# Patient Record
Sex: Male | Born: 1955 | Race: White | Hispanic: No | Marital: Single | Smoking: Current every day smoker
Health system: Southern US, Community
[De-identification: ages and names within clinical notes are randomized; demographics above are authoritative.]

## PROBLEM LIST (undated history)

## (undated) DIAGNOSIS — F419 Anxiety disorder, unspecified: Secondary | ICD-10-CM

## (undated) DIAGNOSIS — G43909 Migraine, unspecified, not intractable, without status migrainosus: Secondary | ICD-10-CM

## (undated) DIAGNOSIS — K219 Gastro-esophageal reflux disease without esophagitis: Secondary | ICD-10-CM

## (undated) DIAGNOSIS — G629 Polyneuropathy, unspecified: Secondary | ICD-10-CM

## (undated) DIAGNOSIS — M069 Rheumatoid arthritis, unspecified: Secondary | ICD-10-CM

## (undated) DIAGNOSIS — I10 Essential (primary) hypertension: Secondary | ICD-10-CM

## (undated) DIAGNOSIS — M549 Dorsalgia, unspecified: Secondary | ICD-10-CM

## (undated) DIAGNOSIS — G8929 Other chronic pain: Secondary | ICD-10-CM

## (undated) DIAGNOSIS — B1920 Unspecified viral hepatitis C without hepatic coma: Secondary | ICD-10-CM

## (undated) DIAGNOSIS — M1990 Unspecified osteoarthritis, unspecified site: Secondary | ICD-10-CM

## (undated) DIAGNOSIS — C3490 Malignant neoplasm of unspecified part of unspecified bronchus or lung: Secondary | ICD-10-CM

## (undated) HISTORY — PX: LUNG SURGERY: SHX703

## (undated) HISTORY — DX: Rheumatoid arthritis, unspecified: M06.9

## (undated) HISTORY — DX: Dorsalgia, unspecified: M54.9

## (undated) HISTORY — PX: KNEE SURGERY: SHX244

## (undated) HISTORY — DX: Migraine, unspecified, not intractable, without status migrainosus: G43.909

## (undated) HISTORY — DX: Gastro-esophageal reflux disease without esophagitis: K21.9

## (undated) HISTORY — DX: Anxiety disorder, unspecified: F41.9

## (undated) HISTORY — DX: Unspecified viral hepatitis C without hepatic coma: B19.20

## (undated) HISTORY — DX: Unspecified osteoarthritis, unspecified site: M19.90

## (undated) HISTORY — DX: Essential (primary) hypertension: I10

## (undated) HISTORY — PX: BRAIN SURGERY: SHX531

## (undated) HISTORY — DX: Polyneuropathy, unspecified: G62.9

## (undated) HISTORY — DX: Malignant neoplasm of unspecified part of unspecified bronchus or lung: C34.90

## (undated) HISTORY — PX: BACK SURGERY: SHX140

## (undated) HISTORY — DX: Other chronic pain: G89.29

## (undated) HISTORY — PX: ROTATOR CUFF REPAIR: SHX139

---

## 2007-11-15 ENCOUNTER — Ambulatory Visit (HOSPITAL_COMMUNITY): Admission: RE | Admit: 2007-11-15 | Discharge: 2007-11-15 | Payer: Self-pay | Admitting: Family Medicine

## 2007-12-13 ENCOUNTER — Ambulatory Visit: Payer: Self-pay | Admitting: Thoracic Surgery

## 2008-02-05 ENCOUNTER — Ambulatory Visit (HOSPITAL_COMMUNITY): Admission: RE | Admit: 2008-02-05 | Discharge: 2008-02-05 | Payer: Self-pay | Admitting: General Surgery

## 2008-03-12 ENCOUNTER — Encounter: Admission: RE | Admit: 2008-03-12 | Discharge: 2008-03-12 | Payer: Self-pay | Admitting: Thoracic Surgery

## 2008-03-12 ENCOUNTER — Ambulatory Visit: Payer: Self-pay | Admitting: Thoracic Surgery

## 2008-09-05 ENCOUNTER — Ambulatory Visit (HOSPITAL_COMMUNITY): Admission: RE | Admit: 2008-09-05 | Discharge: 2008-09-05 | Payer: Self-pay | Admitting: Family Medicine

## 2008-09-16 ENCOUNTER — Encounter: Admission: RE | Admit: 2008-09-16 | Discharge: 2008-09-16 | Payer: Self-pay | Admitting: Thoracic Surgery

## 2008-09-16 ENCOUNTER — Ambulatory Visit: Payer: Self-pay | Admitting: Thoracic Surgery

## 2008-10-16 ENCOUNTER — Ambulatory Visit (HOSPITAL_COMMUNITY): Admission: RE | Admit: 2008-10-16 | Discharge: 2008-10-17 | Payer: Self-pay | Admitting: Neurosurgery

## 2009-05-08 ENCOUNTER — Ambulatory Visit (HOSPITAL_COMMUNITY): Admission: RE | Admit: 2009-05-08 | Discharge: 2009-05-08 | Payer: Self-pay | Admitting: Family Medicine

## 2009-07-29 ENCOUNTER — Ambulatory Visit (HOSPITAL_COMMUNITY): Admission: RE | Admit: 2009-07-29 | Discharge: 2009-07-29 | Payer: Self-pay | Admitting: Family Medicine

## 2010-03-09 ENCOUNTER — Ambulatory Visit (HOSPITAL_COMMUNITY): Admission: RE | Admit: 2010-03-09 | Discharge: 2010-03-09 | Payer: Self-pay | Admitting: Neurosurgery

## 2010-05-04 ENCOUNTER — Ambulatory Visit (HOSPITAL_COMMUNITY): Admission: RE | Admit: 2010-05-04 | Discharge: 2010-05-04 | Payer: Self-pay | Admitting: Neurosurgery

## 2010-05-19 ENCOUNTER — Inpatient Hospital Stay (HOSPITAL_COMMUNITY): Admission: RE | Admit: 2010-05-19 | Discharge: 2010-05-21 | Payer: Self-pay | Admitting: Neurosurgery

## 2010-09-05 ENCOUNTER — Encounter: Payer: Self-pay | Admitting: Thoracic Surgery

## 2010-10-28 LAB — CBC
HCT: 40.6 % (ref 39.0–52.0)
Hemoglobin: 13.6 g/dL (ref 13.0–17.0)
MCH: 33.4 pg (ref 26.0–34.0)
MCHC: 33.5 g/dL (ref 30.0–36.0)
MCV: 99.8 fL (ref 78.0–100.0)
Platelets: 245 10*3/uL (ref 150–400)
RBC: 4.07 MIL/uL — ABNORMAL LOW (ref 4.22–5.81)
RDW: 13.7 % (ref 11.5–15.5)
WBC: 6.8 10*3/uL (ref 4.0–10.5)

## 2010-10-28 LAB — BASIC METABOLIC PANEL
BUN: 12 mg/dL (ref 6–23)
CO2: 31 mEq/L (ref 19–32)
Chloride: 100 mEq/L (ref 96–112)
Creatinine, Ser: 0.82 mg/dL (ref 0.4–1.5)
Potassium: 3.9 mEq/L (ref 3.5–5.1)

## 2010-11-25 LAB — BASIC METABOLIC PANEL
BUN: 11 mg/dL (ref 6–23)
CO2: 27 mEq/L (ref 19–32)
Calcium: 8.7 mg/dL (ref 8.4–10.5)
Chloride: 100 mEq/L (ref 96–112)
Creatinine, Ser: 0.65 mg/dL (ref 0.4–1.5)
GFR calc Af Amer: >60 mL/min (ref >60)
GFR calc non Af Amer: >60 mL/min (ref >60)
Glucose, Bld: 89 mg/dL (ref 70–99)
Potassium: 4.3 mEq/L (ref 3.5–5.1)
Sodium: 135 mEq/L (ref 135–145)

## 2010-11-25 LAB — CBC
HCT: 39.1 % (ref 39.0–52.0)
Hemoglobin: 13.4 g/dL (ref 13.0–17.0)
MCHC: 34.3 g/dL (ref 30.0–36.0)
MCV: 99.1 fL (ref 78.0–100.0)
Platelets: 250 10*3/uL (ref 150–400)
RBC: 3.95 MIL/uL — ABNORMAL LOW (ref 4.22–5.81)
RDW: 14.6 % (ref 11.5–15.5)
WBC: 6.7 10*3/uL (ref 4.0–10.5)

## 2010-11-29 LAB — CREATININE, SERUM
Creatinine, Ser: 0.78 mg/dL (ref 0.4–1.5)
GFR calc Af Amer: >60 mL/min (ref >60)
GFR calc non Af Amer: >60 mL/min (ref >60)

## 2010-12-28 NOTE — Assessment & Plan Note (Signed)
OFFICE VISIT   Dillon, Carter  DOB:  November 28, 1955                                        March 12, 2008  CHART #:  [id number]   The patient comes in today for a 14-month followup.  He has a previous  history of lung cancer having undergone a left lower lobectomy at  [facility].  He was recently seen by Dr. Edwyna Shell in April with a CT scan  showing an area that was questionable in the right middle lobe.  At that  time, Dr. Edwyna Shell felt that it was not concerning for malignancy and was  most consistent with alveolitis or mild scarring.  He did recommend  follow up in 3 months with a chest CT.  The patient has continued to  smoke since that time.  He has had no symptoms related to this and  specifically denies cough, shortness of breath, or hemoptysis.   PHYSICAL EXAMINATION:  VITAL SIGNS:  Blood pressure 148/87, pulse is 73,  respirations 18, and O2 sat 97% on room air.  HEART:  Regular rate and rhythm without murmurs, rubs, or gallops.  LUNGS:  Clear.  Chest CT shows no evidence of recurrent or metastatic  disease.  The area noted in the right middle lobe on the previous x-ray  has resolved.  There was some micronodularity noted in the right upper  lobe, which was felt to be related to either an infectious process or  secondary to smoking such as bronchiolitis or interstitial lung disease.   ASSESSMENT AND PLAN:  Dr. Edwyna Shell has seen the patient and reviewed his  films today and we will plan to see him back in 6 months with a repeat  CT scan for followup.   Dillon Carter, M.D.  Electronically Signed   GC/MEDQ  D:  03/12/2008  T:  03/13/2008  Job:  [id number]   cc:   Dillon Carter, M.D.

## 2010-12-28 NOTE — Assessment & Plan Note (Signed)
OFFICE VISIT   Dillon, Carter  DOB:  Dec 15, 1955                                        December 13, 2007  CHART #:  [id number]   HISTORY OF THE PRESENT ILLNESS:  The patient is a 54 year old male  referred to Dr. Edwyna Shell who saw the patient during this visit.  The  patient has a history of lung cancer, having undergone a previous left  lower lobectomy at [facility] in [place].  He reports that the  type of cancer was related to asbestos exposure.  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  The patient has a long history of tobacco abuse and  continues to smoke approximately 1-1/2 packs per day.  He describes  multiple symptoms including chest pain and pressure type sensation as  well as shortness of breath with exertion.  He describes symptoms  related to reflux as well as symptoms compatible to a Raynaud's type  phenomenon in his hands as well as peripheral vascular arterial  occlusive disease.   MEDICATIONS:  Include the following:  1. Lisinopril/HCTZ 20/12.5 mg daily.  2. Amlodipine 5 mg daily.  3. Hydrocodone p.r.n.   ALLERGIES:  No known drug allergies.   PAST MEDICAL HISTORY:  Also includes hypertension.   FAMILY HISTORY:  Unremarkable.   SOCIAL HISTORY:  He is single.  Tobacco use as described above.  Alcohol  use is significant, approximately 12 alcoholic drinks per week.   PHYSICAL EXAM:  Vital Signs:  Weight is 185 pounds, height 6 feet 0  inches, blood pressure 133/84, pulse 66, respirations 18, oxygen  saturation 99%.  General Appearance:  A well-developed adult male in no  acute distress.  Cardiac Examination:  Regular rate and rhythm.  Pulmonary Examination:  Clear lung sounds.  Extremities:  No edema.   CT scan reviewed by Dr. Edwyna Shell.  Findings in the right middle lobe are  consistent with alveolitis or mild scarring.  Dr. Edwyna Shell does not feel  these are  overly-concerning for malignancy, however, does warrant  further evaluation and followup considering his history.  It is Dr.  Scheryl Darter recommendation to repeat the CT scan in 3 months.  He does not  feel as though the patient needs a PET scan at this time.  We did  discuss the need to a program of tobacco cessation and he will follow up  with his primary physician regarding this.  Additionally, I did discuss  that he is set up for significant vascular disease including peripheral  arterial disease and cardiac disease.  I discussed his chest pain and  told him he may require a stress test of some type, to further evaluate,  if he continues to have these symptoms.  I asked him to discuss this  with his primary physician.  Additionally, the pain in his legs may also  represent, as stated, significant peripheral arterial disease and this  may also warrant further discussion and evaluation.  Our current plan  is, as stated above, to repeat the CT scan in 3 months and go from there  as far as any further evaluation of this right middle lobe finding on CT  scan.   Rowe Clack, P.A.-C.   Sherryll Burger  D:  12/13/2007  T:  12/13/2007  Job:  [id number]   cc:   Ines Bloomer, M.D.  Patrica Duel, M.D.

## 2010-12-28 NOTE — H&P (Signed)
Dillon Carter, Dillon Carter                 ACCOUNT NO.:  [id number]   MEDICAL RECORD NO.:  [id number]           PATIENT TYPE:   LOCATION:                                 FACILITY:   PHYSICIAN:  Dalia Heading, M.D.  DATE OF BIRTH:  1955/12/01   DATE OF ADMISSION:  DATE OF DISCHARGE:  LH                              HISTORY & PHYSICAL   CHIEF COMPLAINT:  Weight loss of unknown etiology, history of lung  carcinoma.   HISTORY OF PRESENT ILLNESS:  The patient is a 55 year old white male who  is referred for an endoscopic evaluation.  He needs a colonoscopy due to  a weight loss of unknown etiology.  He has lost 30 pounds over the past  few months.  No abdominal pain, nausea, vomiting, diarrhea,  constipation, melena, or hematochezia have been noted.  He has never had  a colonoscopy.  There is no family history of colon carcinoma.   PAST MEDICAL HISTORY:  1. Lung CA.  2. COPD.  3. Hypertension.   PAST SURGICAL HISTORY:  Left lower lobectomy.   CURRENT MEDICATIONS:  Albuterol, amoxicillin,  lisinopril/hydrochlorothiazide, amlodipine, and hydrocodone.   ALLERGIES:  No known drug allergies.   REVIEW OF SYSTEMS:  The patient smokes a pack cigarettes a day.  Denies  the alcohol use.  Denies the other cardiopulmonary difficulties or  bleeding disorders.   PHYSICAL EXAMINATION:  GENERAL:  The patient is a well-developed and  well-nourished white male, in no acute distress.  LUNGS:  Clear to auscultation with equal breath sounds bilaterally.  HEART:  Reveals regular rate and rhythm without S3, S4, or murmurs.  ABDOMEN:  Soft, nontender, and nondistended.  No hepatosplenomegaly or  masses are noted.  RECTAL:  Deferred to the procedure.   IMPRESSION:  Weight loss of known etiology.   PLAN:  The patient is scheduled for a colonoscopy on February 05, 2008.  The  risks and benefits of the procedure including bleeding and perforation  were fully explained to the patient, gave informed  consent.      Dalia Heading, M.D.  Electronically Signed     MAJ/MEDQ  D:  01/29/2008  T:  01/30/2008  Job:  [id number]   cc:   Patrica Duel, M.D.  Fax: [phone]

## 2010-12-28 NOTE — Letter (Signed)
September 16, 2008   Patrica Duel, MD  [address], [address]  [place], [place] [address]   Re:  JARYN, ROSKO                 DOB:  [birth year]   Dear Loraine Leriche,   I saw the patient back in the office today and reviewed his CT scan.  His blood pressure was 174/89, pulse 70, respirations 18, and sats were  96%.  Lungs were clear to auscultation and percussion.  The CT scan  showed no evidence of recurrence of cancer, particularly in the right  middle lobe.  There was no really change in any of these small  nodularities.  It is going to be over 6 months, I will just refer him  back to you for longterm followup.  I will be happy to see him again if  there is any other thoughts as far as recurrence of his cancer.  I would  recommend, he get at least a chest x-ray once a year.   Sincerely,   Ines Bloomer, M.D.  Electronically Signed   DPB/MEDQ  D:  09/16/2008  T:  09/16/2008  Job:  [id number]

## 2010-12-28 NOTE — Op Note (Signed)
Dillon Carter, Dillon Carter                 ACCOUNT NO.:  [id number]   MEDICAL RECORD NO.:  [id number]          PATIENT TYPE:  OIB   LOCATION:  3537                         FACILITY:  [facility]   PHYSICIAN:  Cristi [name], M.D.DATE OF BIRTH:  [birth year]   DATE OF PROCEDURE:  10/16/2008  DATE OF DISCHARGE:                               OPERATIVE REPORT   BRIEF HISTORY:  The patient is a 55 year old white male who has  undergone a previous C6 and C7 anterior cervical diskectomy, fusion, and  plating with distractor instrumentation by another physician in another  town.  The patient did well for a couple of years, but then had  developed severe left arm pain consistent with a C8 radiculopathy.  The  patient was worked up with a cervical MRI and it demonstrated a  herniated disk at C7-T1 on the left.  I discussed the various treatment  options with the patient including surgery, although I am still  concerned that he may have osteoporosis.  So, I recommended an  exploration with fusion at C7-T1, anterior cervical diskectomy, fusion,  plating, and placement of interbody prosthesis.  The patient weighed the  risks, benefits, and alternatives of the surgery and desired to proceed  with the operation.   PREOPERATIVE DIAGNOSES:  C7-T1 herniated nucleus pulposus, spinal  stenosis, cervical radiculopathy, cervicalgia, possible pseudoarthrosis.   POSTOPERATIVE DIAGNOSES:  C7-T1 herniated nucleus pulposus, spinal  stenosis, cervical radiculopathy, cervicalgia.  He appeared to have a  good fusion.   PROCEDURE:  Exploration of cervical fusion/removal of Stryker  instrumentation; C7-T1 extensive anterior cervical  diskectomy/decompression; C7-T1 anterior interbody arthrodesis with  local morselized autograft bone and Actifuse bone graft extender;  insertion of C7-T1 interbody prosthesis (PEEK interbody prosthesis); C7-  T1 anterior instrumentation with Codman Slim-Lock titanium plate and  screws.   SURGEON:  Cristi [name], MD   ASSISTANT:  Hilda Lias, MD   ANESTHESIA:  General endotracheal.   ESTIMATED BLOOD LOSS:  100 mL.   SPECIMENS:  None.   DRAINS:  None.   COMPLICATIONS:  None.   DESCRIPTION OF THE PROCEDURE:  The patient was brought to the operating  room by the anesthesia team.  General endotracheal anesthesia was  induced.  The patient remained in supine position.  A roll was placed  under his shoulders to place his neck in slight extension.  His anterior  cervical region was then shaved with clippers and prepared with Betadine  scrub and Betadine solution.  Sterile drapes were applied.  I then  injected the area to be incised with Marcaine with epinephrine solution.  We used a scalpel to make a transverse incision in the patient's right  neck as this is the side that he had his previous surgery on.  I incised  the previous surgical scar.  I used a Metzenbaum scissors to divide the  platysma muscle and dissected through the scar tissue.  We dissected  medial to the sternocleidomastoid muscle, jugular vein, and carotid  artery carefully identifying the esophagus and retracted it medially.  There was quite a bit of scar tissue.  We were able to use kitner swabs  to free up the esophagus .  We incised the scar tissue over the plate to  expose the old plate.  We removed the locking screws and removed the  plate.  We then inspected the arthrodesis at C5-6 and C6-7.  He appeared  to have an adequate arthrodesis and we could not define any clear  motion.   We now turned our attention to the anterior cervical diskectomy.  We  incised C7-T1 anterior cervical disk with a 15-blade scalpel.  We  performed a partial intervertebral diskectomy with a pituitary forceps  and Carlens  curettes.  We then inserted distraction screws at C7 and  T1, distracted the interspace, and then used a high-speed drill to  decorticate the vertebral endplate at C7 and T1.  There was a  remainder  of the C7-T1 intervertebral disk and drilled away some posterior  spondylosis.  We then pinned out the posterior longitudinal ligament  with a drill and incised with arachnoid knife and removed it with a  Kerrison punch undercutting the vertebral endplates.  As expected, we  encountered a large herniated disks on the left compressing the left C8  nerve roots.  We removed the multiple fragments using the pituitary  forceps and the Kerrison punch.  We got a good decompression of the  thecal sac and bilateral C8 nerve roots.  This completed the  decompression.   We now turned our attention to the arthrodesis.  We used trial spacers  and determined to use a 8-mm medium PEEK interbody prosthesis.  We  prefilled this prosthesis with a combination of local morselized  autograft bone and Actifuse bone graft extender.  We inserted the  prosthesis into distracted interspace and then we removed the  distraction screws.  There was a good snug fit of the prosthesis in the  interspace.  This completed the instrumentation and insertion of the  prosthesis.  We now turned our attention to the anterior spinal  instrumentation.  We used a high-speed drill to remove some ventral  spondylosis from C7-T1 vertebral endplates, so the plate lay down flat.  We selected appropriate length, anterior cervical plate and laid along  the anterior aspect of the vertebral bodies at C7 and T1.  We used the  level of the C7 and inserted a 40-mm self-tapping screws.  We drilled to  40 mm of the T1 and inserted two 13-mm self-tapping screws there as  well.  We obtained the intraoperative radiograph.  We could not see the  plate, screws, or interbody prosthesis because of the patient's  shoulders, but it all looked good in vivo.  We, therefore, secured the  screws and plate by locking each cam.  This completed the  instrumentation.   We then obtained hemostasis using bipolar electrocautery.  We irrigated  the  wound out with bacitracin solution.  We then removed the retractors  and then inspected the esophagus for any damage.  It was none apparent.  We then reapproximated the patient's platysma muscle with a 3-0 Vicryl  suture, the subcutaneous tissue with interrupted 3-0 Vicryl suture, and  the skin with Steri-Strips and Benzoin.  The wound was then coated with  bacitracin ointment.  A sterile dressing was applied.  The drapes were  removed.  The patient was subsequently extubated by the anesthesia team  and transported to post anesthesia care unit in stable condition.  All  sponge, instrument, and needle counts were correct at the  end of this  case.      Cristi [name], M.D.  Electronically Signed     JDJ/MEDQ  D:  10/16/2008  T:  10/17/2008  Job:  [id number]

## 2012-11-26 ENCOUNTER — Other Ambulatory Visit (HOSPITAL_COMMUNITY): Payer: Self-pay | Admitting: Internal Medicine

## 2012-11-26 DIAGNOSIS — R6881 Early satiety: Secondary | ICD-10-CM

## 2012-11-26 DIAGNOSIS — R1084 Generalized abdominal pain: Secondary | ICD-10-CM

## 2012-11-26 DIAGNOSIS — R140 Abdominal distension (gaseous): Secondary | ICD-10-CM

## 2012-12-03 ENCOUNTER — Ambulatory Visit (HOSPITAL_COMMUNITY)
Admission: RE | Admit: 2012-12-03 | Discharge: 2012-12-03 | Disposition: A | Discharge status: Home / Self Care | Payer: Self-pay | Source: Ambulatory Visit | Attending: Internal Medicine | Admitting: Internal Medicine

## 2012-12-03 DIAGNOSIS — K429 Umbilical hernia without obstruction or gangrene: Secondary | ICD-10-CM | POA: Insufficient documentation

## 2012-12-03 DIAGNOSIS — R1084 Generalized abdominal pain: Secondary | ICD-10-CM

## 2012-12-03 DIAGNOSIS — I10 Essential (primary) hypertension: Secondary | ICD-10-CM | POA: Insufficient documentation

## 2012-12-03 DIAGNOSIS — Q619 Cystic kidney disease, unspecified: Secondary | ICD-10-CM | POA: Insufficient documentation

## 2012-12-03 DIAGNOSIS — R140 Abdominal distension (gaseous): Secondary | ICD-10-CM

## 2012-12-03 DIAGNOSIS — R109 Unspecified abdominal pain: Secondary | ICD-10-CM | POA: Insufficient documentation

## 2012-12-03 DIAGNOSIS — R6881 Early satiety: Secondary | ICD-10-CM | POA: Insufficient documentation

## 2012-12-03 MED ORDER — IOHEXOL 300 MG/ML  SOLN
100.0000 mL | Freq: Once | INTRAMUSCULAR | Status: AC | PRN
  Administered 2012-12-03: 100 mL via INTRAVENOUS

## 2014-09-24 DIAGNOSIS — Z719 Counseling, unspecified: Secondary | ICD-10-CM | POA: Diagnosis not present

## 2014-09-24 DIAGNOSIS — Z6828 Body mass index (BMI) 28.0-28.9, adult: Secondary | ICD-10-CM | POA: Diagnosis not present

## 2014-09-24 DIAGNOSIS — G894 Chronic pain syndrome: Secondary | ICD-10-CM | POA: Diagnosis not present

## 2014-12-22 DIAGNOSIS — Z6826 Body mass index (BMI) 26.0-26.9, adult: Secondary | ICD-10-CM | POA: Diagnosis not present

## 2014-12-22 DIAGNOSIS — M1991 Primary osteoarthritis, unspecified site: Secondary | ICD-10-CM | POA: Diagnosis not present

## 2014-12-22 DIAGNOSIS — I10 Essential (primary) hypertension: Secondary | ICD-10-CM | POA: Diagnosis not present

## 2014-12-22 DIAGNOSIS — T402X5A Adverse effect of other opioids, initial encounter: Secondary | ICD-10-CM | POA: Diagnosis not present

## 2014-12-22 DIAGNOSIS — F419 Anxiety disorder, unspecified: Secondary | ICD-10-CM | POA: Diagnosis not present

## 2014-12-22 DIAGNOSIS — G629 Polyneuropathy, unspecified: Secondary | ICD-10-CM | POA: Diagnosis not present

## 2015-02-09 DIAGNOSIS — G894 Chronic pain syndrome: Secondary | ICD-10-CM | POA: Diagnosis not present

## 2015-02-09 DIAGNOSIS — E663 Overweight: Secondary | ICD-10-CM | POA: Diagnosis not present

## 2015-02-09 DIAGNOSIS — M5030 Other cervical disc degeneration, unspecified cervical region: Secondary | ICD-10-CM | POA: Diagnosis not present

## 2015-02-09 DIAGNOSIS — Z6825 Body mass index (BMI) 25.0-25.9, adult: Secondary | ICD-10-CM | POA: Diagnosis not present

## 2015-02-09 DIAGNOSIS — Z1389 Encounter for screening for other disorder: Secondary | ICD-10-CM | POA: Diagnosis not present

## 2015-04-07 DIAGNOSIS — Z0000 Encounter for general adult medical examination without abnormal findings: Secondary | ICD-10-CM | POA: Diagnosis not present

## 2015-04-07 DIAGNOSIS — E663 Overweight: Secondary | ICD-10-CM | POA: Diagnosis not present

## 2015-04-07 DIAGNOSIS — M1991 Primary osteoarthritis, unspecified site: Secondary | ICD-10-CM | POA: Diagnosis not present

## 2015-04-07 DIAGNOSIS — Z6826 Body mass index (BMI) 26.0-26.9, adult: Secondary | ICD-10-CM | POA: Diagnosis not present

## 2015-04-07 DIAGNOSIS — I10 Essential (primary) hypertension: Secondary | ICD-10-CM | POA: Diagnosis not present

## 2015-04-07 DIAGNOSIS — G894 Chronic pain syndrome: Secondary | ICD-10-CM | POA: Diagnosis not present

## 2015-06-26 DIAGNOSIS — G894 Chronic pain syndrome: Secondary | ICD-10-CM | POA: Diagnosis not present

## 2015-06-26 DIAGNOSIS — R7309 Other abnormal glucose: Secondary | ICD-10-CM | POA: Diagnosis not present

## 2015-06-26 DIAGNOSIS — Z6824 Body mass index (BMI) 24.0-24.9, adult: Secondary | ICD-10-CM | POA: Diagnosis not present

## 2015-06-26 DIAGNOSIS — R200 Anesthesia of skin: Secondary | ICD-10-CM | POA: Diagnosis not present

## 2015-09-04 DIAGNOSIS — M5441 Lumbago with sciatica, right side: Secondary | ICD-10-CM | POA: Diagnosis not present

## 2015-09-04 DIAGNOSIS — M5136 Other intervertebral disc degeneration, lumbar region: Secondary | ICD-10-CM | POA: Diagnosis not present

## 2015-09-04 DIAGNOSIS — M5442 Lumbago with sciatica, left side: Secondary | ICD-10-CM | POA: Diagnosis not present

## 2015-09-19 DIAGNOSIS — M5136 Other intervertebral disc degeneration, lumbar region: Secondary | ICD-10-CM | POA: Diagnosis not present

## 2015-09-22 DIAGNOSIS — M5442 Lumbago with sciatica, left side: Secondary | ICD-10-CM | POA: Diagnosis not present

## 2015-09-22 DIAGNOSIS — M5441 Lumbago with sciatica, right side: Secondary | ICD-10-CM | POA: Diagnosis not present

## 2015-09-22 DIAGNOSIS — M5136 Other intervertebral disc degeneration, lumbar region: Secondary | ICD-10-CM | POA: Diagnosis not present

## 2015-09-22 DIAGNOSIS — G8929 Other chronic pain: Secondary | ICD-10-CM | POA: Diagnosis not present

## 2015-09-30 DIAGNOSIS — G894 Chronic pain syndrome: Secondary | ICD-10-CM | POA: Diagnosis not present

## 2015-09-30 DIAGNOSIS — I8312 Varicose veins of left lower extremity with inflammation: Secondary | ICD-10-CM | POA: Diagnosis not present

## 2015-09-30 DIAGNOSIS — Z1389 Encounter for screening for other disorder: Secondary | ICD-10-CM | POA: Diagnosis not present

## 2015-10-07 DIAGNOSIS — M5442 Lumbago with sciatica, left side: Secondary | ICD-10-CM | POA: Diagnosis not present

## 2015-11-02 DIAGNOSIS — E1143 Type 2 diabetes mellitus with diabetic autonomic (poly)neuropathy: Secondary | ICD-10-CM | POA: Diagnosis not present

## 2015-11-12 ENCOUNTER — Ambulatory Visit: Payer: Medicare Other | Admitting: Neurology

## 2015-11-17 DIAGNOSIS — M5441 Lumbago with sciatica, right side: Secondary | ICD-10-CM | POA: Diagnosis not present

## 2015-11-17 DIAGNOSIS — M5136 Other intervertebral disc degeneration, lumbar region: Secondary | ICD-10-CM | POA: Diagnosis not present

## 2015-11-17 DIAGNOSIS — M5442 Lumbago with sciatica, left side: Secondary | ICD-10-CM | POA: Diagnosis not present

## 2015-11-18 ENCOUNTER — Encounter: Payer: Self-pay | Admitting: Neurology

## 2015-11-18 ENCOUNTER — Ambulatory Visit (INDEPENDENT_AMBULATORY_CARE_PROVIDER_SITE_OTHER): Payer: Medicare Other | Admitting: Neurology

## 2015-11-18 VITALS — BP 142/78 | HR 88 | Ht 72.0 in | Wt 172.8 lb

## 2015-11-18 DIAGNOSIS — R600 Localized edema: Secondary | ICD-10-CM | POA: Diagnosis not present

## 2015-11-18 DIAGNOSIS — R202 Paresthesia of skin: Secondary | ICD-10-CM | POA: Diagnosis not present

## 2015-11-18 DIAGNOSIS — R269 Unspecified abnormalities of gait and mobility: Secondary | ICD-10-CM | POA: Diagnosis not present

## 2015-11-18 NOTE — Progress Notes (Signed)
PATIENT: Dillon Carter DOB: 06-Aug-1956  Chief Complaint  Patient presents with  . Peripheral Neuropathy    He has been experiencing itching, swelling and pain in his bilateral lower extremities.  He has tried gabapentin in the past but is unsure of the dosage.     HISTORICAL  Dillon Carter is a 60 years old right-handed male, seen in refer by orthopedic surgeon Dr. Wylene Simmer for evaluation of bilateral feet and leg paresthesia pain in November 18 2015, His primary care physician is Orthopedic surgeon Dr. Wylene Simmer: his primary care physician is Dr.Lawrence Gerarda Fraction, MD  He had a past medical history of hypertension, left lung lobectomy for lung cancer in 2002, no chemotherapy or radiation therapy required, also had a history of left rotator cuff surgery, he had a history of cervical decompression surgery twice, the first was 2006 at [facility] by Dr. Cyndia Diver, He presented with neck pain, radiation pain to left arm, second surgery was by Dr. 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   He used to work as an Clinical biochemist, since his most recent cervical surgery, He has developed bilateral finger paresthesia and weakness, went on disability. He denies gait difficulty at that time.  Since early 2016, he noticed worsening low back pain, at the same time bilateral lower extremity swelling, sweaty, he has to change his socks 6 time each day, difficulty moving his toes, over the past almost 2 years, he had gradual increase gait difficulty, moderate to severe low back pain, radiating pain to bilateral lower extremity, frequent muscle cramps involving calf and thighs, he denies bowel and bladder incontinence.  He was evaluated by orthopedic surgeon, I have reviewed MRI of lumbar in September 19 2015, there is evidence of multilevel degenerative disc  disease most severe at L3-4, moderate central canal stenosis, bilateral recess stenosis with encroachment of the L4 nerve roots,  There was a mention of lumbar decompression fusion surgery by orthopedic surgeon Dr. Rolena Infante, he is referred to neurology counsel to make sure there is no other etiology to account for his complains of bilateral lower extremity swelling, gait difficulty  REVIEW OF SYSTEMS: Full 14 system review of systems performed and notable only for as above  ALLERGIES: Allergies  Allergen Reactions  . Codeine Rash    HOME MEDICATIONS: Current Outpatient Prescriptions  Medication Sig Dispense Refill  . ALPRAZolam (XANAX) 1 MG tablet Take 1 mg by mouth.    Marland Kitchen amLODipine (NORVASC) 10 MG tablet Take 10 mg by mouth.    . enalapril (VASOTEC) 10 MG tablet Take 10 mg by mouth.    . hydrochlorothiazide (HYDRODIURIL) 25 MG tablet Take 25 mg by mouth.    Marland Kitchen HYDROcodone-acetaminophen (NORCO) 10-325 MG tablet Take by mouth.    . Omeprazole 20 MG TBEC Take by mouth.     No current facility-administered medications for this visit.    PAST MEDICAL HISTORY: Past Medical History  Diagnosis Date  . Chronic back pain   . Hypertension   . Anxiety   . GERD (gastroesophageal reflux disease)   . Migraine   . Hepatitis C   . Lung cancer ([place])   . Osteoarthritis   . Peripheral neuropathy ([place])   . Rheumatoid arthritis ([place])     PAST SURGICAL HISTORY: Past Surgical History  Procedure Laterality Date  . Lung  surgery    . Back surgery    . Rotator cuff repair    . Knee surgery    . Brain surgery      Removed blood clot    FAMILY HISTORY: Family History  Problem Relation Age of Onset  . Hypertension Mother   . Neuropathy Mother   . Dementia Mother   . Breast cancer Mother   . COPD Father   . Congestive Heart Failure Father   . Aneurysm Father     SOCIAL HISTORY:  Social History   Social History  . Marital Status: Single    Spouse Name: N/A  . Number of Children: 0    . Years of Education: GED   Occupational History  . Disabled    Social History Main Topics  . Smoking status: Current Every Day Smoker -- 1.00 packs/day    Types: Cigarettes  . Smokeless tobacco: Not on file  . Alcohol Use: 0.0 oz/week    0 Standard drinks or equivalent per week     Comment: Drinks at least 12 pack of beer per week - sometimes more.  . Drug Use: No  . Sexual Activity: Not on file   Other Topics Concern  . Not on file   Social History Narrative   Lives at home friend.   Right-handed.   1 cup caffeine per day.     PHYSICAL EXAM   Filed Vitals:   11/18/15 1345  BP: 142/78  Pulse: 88  Height: 6' (1.829 m)  Weight: 172 lb 12 oz (78.359 kg)    Not recorded      Body mass index is 23.42 kg/(m^2).  PHYSICAL EXAMNIATION:  Gen: NAD, conversant, well nourised, obese, well groomed                     Cardiovascular: Regular rate rhythm, no peripheral edema, warm, nontender. Eyes: Conjunctivae clear without exudates or hemorrhage Neck: Supple, no carotid bruise. Pulmonary: Clear to auscultation bilaterally   NEUROLOGICAL EXAM:  MENTAL STATUS: Speech:    Speech is normal; fluent and spontaneous with normal comprehension.  Cognition:     Orientation to time, place and person     Normal recent and remote memory     Normal Attention span and concentration     Normal Language, naming, repeating,spontaneous speech     Fund of knowledge   CRANIAL NERVES: CN II: Visual fields are full to confrontation. Fundoscopic exam is normal with sharp discs and no vascular changes. Pupils are round equal and briskly reactive to light. CN III, IV, VI: extraocular movement are normal. No ptosis. CN V: Facial sensation is intact to pinprick in all 3 divisions bilaterally. Corneal responses are intact.  CN VII: Face is symmetric with normal eye closure and smile. CN VIII: Hearing is normal to rubbing fingers CN IX, X: Palate elevates symmetrically. Phonation is  normal. CN XI: Head turning and shoulder shrug are intact CN XII: Tongue is midline with normal movements and no atrophy.  MOTOR: Discoloration of bilateral lower extremity, bilateral foot pitting edema, limited range of motion of bilateral shoulders, he has mild bilateral grip weakness, there was no significant lower extremity muscle weakness notice.  REFLEXES: Reflexes are 2+ and symmetric at the biceps, triceps, knees, and ankles. Plantar responses are flexor.  SENSORY: Length dependent decreased to light touch, pinprick and vibratory sensation to midshin level.  COORDINATION: Rapid alternating movements and fine finger movements are intact. There is no dysmetria on finger-to-nose  and heel-knee-shin.    GAIT/STANCE: Need push up to get up from seated position, antalgic, cautious,   DIAGNOSTIC DATA (LABS, IMAGING, TESTING) - I reviewed patient records, labs, notes, testing and imaging myself where available.   ASSESSMENT AND PLAN  Dillon Carter is a 60 y.o. male   Lumbar stenosis, gait abnormality  Proceed with EMG nerve conduction study  He has significant bilateral lower extremity edema, decreased pulses at bilateral lower extremity,  Proceed with Doppler study of lower extremity to rule out arterial insufficiency, venous thrombosis.     Marcial Pacas, M.D. Ph.D.  [facility] [address], [place],  [address] Ph: [phone] Fax: [phone]  CC: Orthopedic surgeon Dr. Wylene Simmer: his primary care physician is Dr.Lawrence Gerarda Fraction, MD

## 2015-11-20 ENCOUNTER — Telehealth: Payer: Self-pay | Admitting: Neurology

## 2015-11-20 DIAGNOSIS — R600 Localized edema: Secondary | ICD-10-CM

## 2015-11-20 DIAGNOSIS — R269 Unspecified abnormalities of gait and mobility: Secondary | ICD-10-CM

## 2015-11-20 DIAGNOSIS — R202 Paresthesia of skin: Secondary | ICD-10-CM

## 2015-11-20 NOTE — Telephone Encounter (Signed)
[facility] [phone] called said she has scheduled the bil lower ext venous, but the lower arterial will require an ABI to be performed first and will need an order for that. Sts if that is abnormal then the study will be scheduled at [facility] or [facility]. She said typically if ABI is normal then arterial does not need to be done. The ABI is protocotol now so insurance will pay.

## 2015-11-23 ENCOUNTER — Other Ambulatory Visit: Payer: Self-pay | Admitting: *Deleted

## 2015-11-23 NOTE — Telephone Encounter (Signed)
Spoke to Butch Penny - she is requesting an order for ABI (order [id number]) for patient.  Ok per vo by Dr. Leta Baptist to place order.

## 2015-11-24 DIAGNOSIS — M5136 Other intervertebral disc degeneration, lumbar region: Secondary | ICD-10-CM | POA: Diagnosis not present

## 2015-11-24 DIAGNOSIS — M5441 Lumbago with sciatica, right side: Secondary | ICD-10-CM | POA: Diagnosis not present

## 2015-12-01 ENCOUNTER — Encounter: Payer: Self-pay | Admitting: Neurology

## 2015-12-03 ENCOUNTER — Ambulatory Visit (HOSPITAL_BASED_OUTPATIENT_CLINIC_OR_DEPARTMENT_OTHER)
Admission: RE | Admit: 2015-12-03 | Discharge: 2015-12-03 | Disposition: A | Discharge status: Home / Self Care | Payer: Medicare Other | Source: Ambulatory Visit | Attending: Diagnostic Neuroimaging | Admitting: Diagnostic Neuroimaging

## 2015-12-03 ENCOUNTER — Ambulatory Visit (HOSPITAL_COMMUNITY)
Admission: RE | Admit: 2015-12-03 | Discharge: 2015-12-03 | Disposition: A | Discharge status: Home / Self Care | Payer: Medicare Other | Source: Ambulatory Visit | Attending: Neurology | Admitting: Neurology

## 2015-12-03 ENCOUNTER — Encounter (HOSPITAL_COMMUNITY): Payer: Self-pay

## 2015-12-03 DIAGNOSIS — R600 Localized edema: Secondary | ICD-10-CM

## 2015-12-03 DIAGNOSIS — R202 Paresthesia of skin: Secondary | ICD-10-CM | POA: Diagnosis not present

## 2015-12-03 DIAGNOSIS — R269 Unspecified abnormalities of gait and mobility: Secondary | ICD-10-CM

## 2015-12-03 NOTE — Progress Notes (Signed)
VASCULAR LAB PRELIMINARY  PRELIMINARY  PRELIMINARY  PRELIMINARY  Bilateral lower extremity venous duplex completed.     Bilateral:  No evidence of DVT, superficial thrombosis, or Baker's Cyst.   Janifer Adie, RVT, RDMS 12/03/2015, 12:23 PM

## 2015-12-03 NOTE — Progress Notes (Signed)
  VASCULAR LAB PRELIMINARY  PRELIMINARY  PRELIMINARY  PRELIMINARY  VASCULAR LAB PRELIMINARY  ARTERIAL  ABI completed:    RIGHT    LEFT    PRESSURE WAVEFORM  PRESSURE WAVEFORM  BRACHIAL 134 Triphasic BRACHIAL 149 Triphasic  DP 172 Triphasic DP 169 Triphasic  PT 166 Triphasic PT 156 Triphasic    RIGHT LEFT  ABI 1.15 1.13   Bilateral- ABI's is suggestive to be within normal limits.  Janifer Adie, RVT 12/03/2015, 12:37 PM

## 2015-12-09 ENCOUNTER — Encounter: Payer: Medicare Other | Admitting: Neurology

## 2015-12-17 ENCOUNTER — Ambulatory Visit (INDEPENDENT_AMBULATORY_CARE_PROVIDER_SITE_OTHER): Payer: Medicare Other | Admitting: Neurology

## 2015-12-17 DIAGNOSIS — M79604 Pain in right leg: Secondary | ICD-10-CM | POA: Diagnosis not present

## 2015-12-17 DIAGNOSIS — R202 Paresthesia of skin: Secondary | ICD-10-CM

## 2015-12-17 DIAGNOSIS — M79605 Pain in left leg: Secondary | ICD-10-CM

## 2015-12-17 DIAGNOSIS — R269 Unspecified abnormalities of gait and mobility: Secondary | ICD-10-CM

## 2015-12-17 DIAGNOSIS — R600 Localized edema: Secondary | ICD-10-CM

## 2015-12-18 DIAGNOSIS — M79604 Pain in right leg: Secondary | ICD-10-CM | POA: Insufficient documentation

## 2015-12-18 DIAGNOSIS — M79605 Pain in left leg: Secondary | ICD-10-CM

## 2015-12-18 NOTE — Procedures (Signed)
   NCS (NERVE CONDUCTION STUDY) WITH EMG (ELECTROMYOGRAPHY) REPORT   STUDY DATE: Dec 17 2015 PATIENT NAME: Dillon Carter DOB: 1955-10-18 MRN: [id number]    TECHNOLOGIST: Laretta Alstrom ELECTROMYOGRAPHER: Marcial Pacas M.D.  CLINICAL INFORMATION:  60 year old male presented with history of chronic low back pain, radiating pain to bilateral lower extremity, bilateral foot and leg swelling  FINDINGS: NERVE CONDUCTION STUDY: Bilateral peroneal sensory responses were normal. Bilateral peroneal to EDB, and tibial motor responses were normal. Bilateral tibial H reflexes were normal and symmetric.   NEEDLE ELECTROMYOGRAPHY: Selected needle examinations were performed at bilateral lower extremity muscles, bilateral lumbar sacral paraspinal muscles.  Bilateral tibialis anterior, tibialis posterior, vastus lateralis, peroneal longus, left biceps femoris long head: Normal insertion activity, no spontaneous activity, mildly enlarged motor unit potential, with mildly decreased recruitment patterns.  Needle examination of left gluteus medius was normal.  There was increased insertional activity, 1 plus spontaneous activity at left L3, right L4, increased insertion activity at bilateral L5-S1.   IMPRESSION:  This is a mild abnormal study. There is electrodiagnostic evidence of mild chronic bilateral lumbar sacral radiculopathy.  There is no evidence of large fiber peripheral neuropathy.   INTERPRETING PHYSICIAN:   Marcial Pacas M.D. Ph.D. [facility] [address], [place],  [address] [phone]

## 2015-12-18 NOTE — Progress Notes (Signed)
PATIENT: Dillon Carter DOB: 08-03-1956  No chief complaint on file.    HISTORICAL  Dillon Carter is a 59 years old right-handed male, seen in refer by orthopedic surgeon Dr. Wylene Simmer for evaluation of bilateral feet and leg paresthesia pain in November 18 2015, His primary care physician is Orthopedic surgeon Dr. Wylene Simmer: his primary care physician is Dr.Lawrence Gerarda Fraction, MD  He had a past medical history of hypertension, left lung lobectomy for lung cancer in 2002, no chemotherapy or radiation therapy required, also had a history of left rotator cuff surgery, he had a history of cervical decompression surgery twice, the first was 2006 at [facility] by Dr. Cyndia Diver, He presented with neck pain, radiation pain to left arm, second surgery was by Dr. 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   He used to work as an Clinical biochemist, since his most recent cervical surgery, He has developed bilateral finger paresthesia and weakness, went on disability. He denies gait difficulty at that time.  Since early 2016, he noticed worsening low back pain, at the same time bilateral lower extremity swelling, sweaty, he has to change his socks 6 time each day, difficulty moving his toes, over the past almost 2 years, he had gradual increase gait difficulty, moderate to severe low back pain, radiating pain to bilateral lower extremity, frequent muscle cramps involving calf and thighs, he denies bowel and bladder incontinence.  He was evaluated by orthopedic surgeon, I have reviewed MRI of lumbar in September 19 2015, there is evidence of multilevel degenerative disc disease most severe at L3-4, moderate central canal stenosis, bilateral recess stenosis with encroachment of the L4 nerve roots,  There was a mention of lumbar decompression fusion surgery by  orthopedic surgeon Dr. Rolena Infante, he is referred to neurology counsel to make sure there is no other etiology to account for his complains of bilateral lower extremity swelling, gait difficulty  Update Dec 17 2015: I perform electrodiagnostic study today, there is evidence of mild lumbar sacral radiculopathy, there is no evidence of large fiber peripheral neuropathy. He continues to complains significant current bilateral lower extremity swelling, pain, especially after bearing weight. I have reviewed Doppler study of bilateral lower extremity, there is no evidence of arterial insufficiency, or venous thrombosis.  He complains of chest pressure, short-winded with minimum exertion, limited mobility.   REVIEW OF SYSTEMS: Full 14 system review of systems performed and notable only for as above  ALLERGIES: Allergies  Allergen Reactions  . Codeine Rash    HOME MEDICATIONS: Current Outpatient Prescriptions  Medication Sig Dispense Refill  . ALPRAZolam (XANAX) 1 MG tablet Take 1 mg by mouth.    Marland Kitchen amLODipine (NORVASC) 10 MG tablet Take 10 mg by mouth.    . enalapril (VASOTEC) 10 MG tablet Take 10 mg by mouth.    . hydrochlorothiazide (HYDRODIURIL) 25 MG tablet Take 25 mg by mouth.    Marland Kitchen HYDROcodone-acetaminophen (NORCO) 10-325 MG tablet Take by mouth.    . Omeprazole 20 MG TBEC Take by mouth.     No current facility-administered medications for this visit.    PAST MEDICAL HISTORY: Past Medical History  Diagnosis Date  . Chronic back pain   . Hypertension   . Anxiety   . GERD (gastroesophageal reflux disease)   . Migraine   . Hepatitis C   .  Lung cancer ([place])   . Osteoarthritis   . Peripheral neuropathy ([place])   . Rheumatoid arthritis ([place])     PAST SURGICAL HISTORY: Past Surgical History  Procedure Laterality Date  . Lung surgery    . Back surgery    . Rotator cuff repair    . Knee surgery    . Brain surgery      Removed blood clot    FAMILY HISTORY: Family History    Problem Relation Age of Onset  . Hypertension Mother   . Neuropathy Mother   . Dementia Mother   . Breast cancer Mother   . COPD Father   . Congestive Heart Failure Father   . Aneurysm Father     SOCIAL HISTORY:  Social History   Social History  . Marital Status: Single    Spouse Name: N/A  . Number of Children: 0  . Years of Education: GED   Occupational History  . Disabled    Social History Main Topics  . Smoking status: Current Every Day Smoker -- 1.00 packs/day    Types: Cigarettes  . Smokeless tobacco: Not on file  . Alcohol Use: 0.0 oz/week    0 Standard drinks or equivalent per week     Comment: Drinks at least 12 pack of beer per week - sometimes more.  . Drug Use: No  . Sexual Activity: Not on file   Other Topics Concern  . Not on file   Social History Narrative   Lives at home friend.   Right-handed.   1 cup caffeine per day.     PHYSICAL EXAM   There were no vitals filed for this visit.  Not recorded      There is no weight on file to calculate BMI.  PHYSICAL EXAMNIATION:  Gen: NAD, conversant, well nourised, obese, well groomed                     Cardiovascular: Regular rate rhythm, no peripheral edema, warm, nontender. Eyes: Conjunctivae clear without exudates or hemorrhage Neck: Supple, no carotid bruise. Pulmonary: Clear to auscultation bilaterally   NEUROLOGICAL EXAM:  MENTAL STATUS: Speech:    Speech is normal; fluent and spontaneous with normal comprehension.  Cognition:     Orientation to time, place and person     Normal recent and remote memory     Normal Attention span and concentration     Normal Language, naming, repeating,spontaneous speech     Fund of knowledge   CRANIAL NERVES: CN II: Visual fields are full to confrontation. Fundoscopic exam is normal with sharp discs and no vascular changes. Pupils are round equal and briskly reactive to light. CN III, IV, VI: extraocular movement are normal. No ptosis. CN V:  Facial sensation is intact to pinprick in all 3 divisions bilaterally. Corneal responses are intact.  CN VII: Face is symmetric with normal eye closure and smile. CN VIII: Hearing is normal to rubbing fingers CN IX, X: Palate elevates symmetrically. Phonation is normal. CN XI: Head turning and shoulder shrug are intact CN XII: Tongue is midline with normal movements and no atrophy.  MOTOR: Discoloration of bilateral lower extremity, bilateral foot pitting edema, limited range of motion of bilateral shoulders, he has mild bilateral grip weakness, there was no significant lower extremity muscle weakness notice.  REFLEXES: Reflexes are 2+ and symmetric at the biceps, triceps, knees, and ankles. Plantar responses are flexor.  SENSORY: Length dependent decreased to light touch, pinprick and vibratory sensation  to midshin level.  COORDINATION: Rapid alternating movements and fine finger movements are intact. There is no dysmetria on finger-to-nose and heel-knee-shin.    GAIT/STANCE: Need push up to get up from seated position, antalgic, cautious,   DIAGNOSTIC DATA (LABS, IMAGING, TESTING) - I reviewed patient records, labs, notes, testing and imaging myself where available.   ASSESSMENT AND PLAN  Dillon Carter is a 60 y.o. male   Lumbar stenosis, gait abnormality  EMG nerve conduction study today confirmed mild bilateral lumbar sacral radiculopathy,   His complaint of significant bilateral lower extremity edema and pain are atypical for lumbar radiculopathy  He has significant distal leg and bilateral feet skin discoloration, suggestive of long-term venous stasis, will proceed with echocardiogram     Marcial Pacas, M.D. Ph.D.  [facility] [address] [place], [place] [address] Ph: [phone] Fax: [phone]  CC: Orthopedic surgeon Dr. Wylene Simmer: his primary care physician is Dr.Lawrence Gerarda Fraction, MD

## 2015-12-22 DIAGNOSIS — M5136 Other intervertebral disc degeneration, lumbar region: Secondary | ICD-10-CM | POA: Diagnosis not present

## 2015-12-22 DIAGNOSIS — M5441 Lumbago with sciatica, right side: Secondary | ICD-10-CM | POA: Diagnosis not present

## 2015-12-22 DIAGNOSIS — M5442 Lumbago with sciatica, left side: Secondary | ICD-10-CM | POA: Diagnosis not present

## 2015-12-29 DIAGNOSIS — Z1389 Encounter for screening for other disorder: Secondary | ICD-10-CM | POA: Diagnosis not present

## 2015-12-29 DIAGNOSIS — R609 Edema, unspecified: Secondary | ICD-10-CM | POA: Diagnosis not present

## 2015-12-29 DIAGNOSIS — G894 Chronic pain syndrome: Secondary | ICD-10-CM | POA: Diagnosis not present

## 2015-12-29 DIAGNOSIS — I739 Peripheral vascular disease, unspecified: Secondary | ICD-10-CM | POA: Diagnosis not present

## 2016-02-04 DIAGNOSIS — B354 Tinea corporis: Secondary | ICD-10-CM | POA: Diagnosis not present

## 2016-02-11 DIAGNOSIS — Z885 Allergy status to narcotic agent status: Secondary | ICD-10-CM | POA: Diagnosis not present

## 2016-02-11 DIAGNOSIS — Z79899 Other long term (current) drug therapy: Secondary | ICD-10-CM | POA: Diagnosis not present

## 2016-02-11 DIAGNOSIS — G709 Myoneural disorder, unspecified: Secondary | ICD-10-CM | POA: Diagnosis not present

## 2016-02-11 DIAGNOSIS — C3490 Malignant neoplasm of unspecified part of unspecified bronchus or lung: Secondary | ICD-10-CM | POA: Diagnosis not present

## 2016-02-11 DIAGNOSIS — E871 Hypo-osmolality and hyponatremia: Secondary | ICD-10-CM | POA: Diagnosis not present

## 2016-02-11 DIAGNOSIS — Z79891 Long term (current) use of opiate analgesic: Secondary | ICD-10-CM | POA: Diagnosis not present

## 2016-02-11 DIAGNOSIS — L309 Dermatitis, unspecified: Secondary | ICD-10-CM | POA: Diagnosis not present

## 2016-02-11 DIAGNOSIS — R079 Chest pain, unspecified: Secondary | ICD-10-CM | POA: Diagnosis not present

## 2016-03-04 DIAGNOSIS — L237 Allergic contact dermatitis due to plants, except food: Secondary | ICD-10-CM | POA: Diagnosis not present

## 2016-03-24 DIAGNOSIS — Z1389 Encounter for screening for other disorder: Secondary | ICD-10-CM | POA: Diagnosis not present

## 2016-03-24 DIAGNOSIS — R233 Spontaneous ecchymoses: Secondary | ICD-10-CM | POA: Diagnosis not present

## 2016-03-24 DIAGNOSIS — Z0000 Encounter for general adult medical examination without abnormal findings: Secondary | ICD-10-CM | POA: Diagnosis not present

## 2016-03-24 DIAGNOSIS — G894 Chronic pain syndrome: Secondary | ICD-10-CM | POA: Diagnosis not present

## 2016-03-28 DIAGNOSIS — L2084 Intrinsic (allergic) eczema: Secondary | ICD-10-CM | POA: Diagnosis not present

## 2016-03-28 DIAGNOSIS — L500 Allergic urticaria: Secondary | ICD-10-CM | POA: Diagnosis not present

## 2016-03-30 DIAGNOSIS — E0590 Thyrotoxicosis, unspecified without thyrotoxic crisis or storm: Secondary | ICD-10-CM | POA: Diagnosis not present

## 2016-05-04 ENCOUNTER — Encounter: Payer: Self-pay | Admitting: "Endocrinology

## 2016-05-09 DIAGNOSIS — M5442 Lumbago with sciatica, left side: Secondary | ICD-10-CM | POA: Diagnosis not present

## 2016-05-09 DIAGNOSIS — M5441 Lumbago with sciatica, right side: Secondary | ICD-10-CM | POA: Diagnosis not present

## 2016-05-09 DIAGNOSIS — M5136 Other intervertebral disc degeneration, lumbar region: Secondary | ICD-10-CM | POA: Diagnosis not present

## 2016-05-18 DIAGNOSIS — M5136 Other intervertebral disc degeneration, lumbar region: Secondary | ICD-10-CM | POA: Diagnosis not present

## 2016-05-19 DIAGNOSIS — E049 Nontoxic goiter, unspecified: Secondary | ICD-10-CM | POA: Diagnosis not present

## 2016-05-19 DIAGNOSIS — E042 Nontoxic multinodular goiter: Secondary | ICD-10-CM | POA: Diagnosis not present

## 2016-05-24 DIAGNOSIS — M5442 Lumbago with sciatica, left side: Secondary | ICD-10-CM | POA: Diagnosis not present

## 2016-05-24 DIAGNOSIS — M5136 Other intervertebral disc degeneration, lumbar region: Secondary | ICD-10-CM | POA: Diagnosis not present

## 2016-05-24 DIAGNOSIS — M5441 Lumbago with sciatica, right side: Secondary | ICD-10-CM | POA: Diagnosis not present

## 2016-05-26 ENCOUNTER — Encounter: Payer: Self-pay | Admitting: "Endocrinology

## 2016-05-26 ENCOUNTER — Other Ambulatory Visit: Payer: Self-pay | Admitting: "Endocrinology

## 2016-05-26 ENCOUNTER — Ambulatory Visit (INDEPENDENT_AMBULATORY_CARE_PROVIDER_SITE_OTHER): Payer: Medicare Other | Admitting: "Endocrinology

## 2016-05-26 VITALS — BP 154/82 | HR 94 | Ht 72.0 in | Wt 175.0 lb

## 2016-05-26 DIAGNOSIS — R946 Abnormal results of thyroid function studies: Secondary | ICD-10-CM | POA: Diagnosis not present

## 2016-05-26 DIAGNOSIS — R7989 Other specified abnormal findings of blood chemistry: Secondary | ICD-10-CM | POA: Insufficient documentation

## 2016-05-26 NOTE — Progress Notes (Signed)
Subjective:    Patient ID: Dillon Carter, male    DOB: May 28, 1956, PCP Glo Herring., MD.   Past Medical History:  Diagnosis Date  . Anxiety   . Chronic back pain   . GERD (gastroesophageal reflux disease)   . Hepatitis C   . Hypertension   . Lung cancer ([place])   . Migraine   . Osteoarthritis   . Peripheral neuropathy ([place])   . Rheumatoid arthritis [facility])    Past Surgical History:  Procedure Laterality Date  . BACK SURGERY    . BRAIN SURGERY     Removed blood clot  . KNEE SURGERY    . LUNG SURGERY    . ROTATOR CUFF REPAIR     Social History   Social History  . Marital status: Single    Spouse name: N/A  . Number of children: 0  . Years of education: GED   Occupational History  . Disabled    Social History Main Topics  . Smoking status: Current Every Day Smoker    Packs/day: 1.00    Types: Cigarettes  . Smokeless tobacco: Not on file  . Alcohol use 0.0 oz/week     Comment: Drinks at least 12 pack of beer per week - sometimes more.  . Drug use: No  . Sexual activity: Not on file   Other Topics Concern  . Not on file   Social History Narrative   Lives at home friend.   Right-handed.   1 cup caffeine per day.   Outpatient Encounter Prescriptions as of 05/26/2016  Medication Sig  . ALPRAZolam (XANAX) 1 MG tablet Take 1 mg by mouth.  Marland Kitchen amLODipine (NORVASC) 10 MG tablet Take 10 mg by mouth.  . enalapril (VASOTEC) 10 MG tablet Take 10 mg by mouth.  . hydrochlorothiazide (HYDRODIURIL) 25 MG tablet Take 25 mg by mouth.  Marland Kitchen HYDROcodone-acetaminophen (NORCO) 10-325 MG tablet Take by mouth.  . Omeprazole 20 MG TBEC Take by mouth.   No facility-administered encounter medications on file as of 05/26/2016.    ALLERGIES: Allergies  Allergen Reactions  . Codeine Rash   VACCINATION STATUS:  There is no immunization history on file for this patient.  HPI  The patient presents today with a medical history as above, and is being seen in consultation for  hyperthyroidism requested by Dr. Gerarda Fraction.   - He was found to have abnormal thyroid function test involving slightly suppressed TSH of 0.32 ( 0.45-4.5)  and slightly lowered total T4 4 .4  ( normal  4.5-12) on 03/30/2016.   - He denies heat or cold intolerance, palpitations, significant weight change .  - He underwent thyroid ultrasound which was reported to be unremarkable. He did not get thyroid uptake and scan.     The patient denies family history of thyroid dysfunction   in his family,  and the patient denies personal history of goiter. Patient is not on any anti-thyroid medications nor on any thyroid hormone supplements. Patient  is willing to proceed with appropriate work up and therapy for thyrotoxicosis.   Review of Systems  Constitutional: no recent  weight change, + fatigue, no subjective hyperthermia/hypothermia Eyes: no blurry vision, no xerophthalmia ENT: no sore throat, no nodules palpated in throat, no dysphagia/odynophagia, no hoarseness Cardiovascular: no CP/SOB/palpitations/leg swelling Respiratory: no cough/SOB Gastrointestinal: no N/V/D/C Musculoskeletal: no muscle/joint aches Skin: + rashes Neurological: no tremors/numbness/tingling/dizziness Psychiatric:  + depression, +anxiety  Objective:    BP (!) 154/82   Pulse 94  Ht 6' (1.829 m)   Wt 175 lb (79.4 kg)   BMI 23.73 kg/m   Wt Readings from Last 3 Encounters:  05/26/16 175 lb (79.4 kg)  11/18/15 172 lb 12 oz (78.4 kg)    Physical Exam Constitutional: in NAD Eyes: PERRLA, EOMI, no exophthalmos ENT: moist mucous membranes, no thyromegaly, no cervical lymphadenopathy Cardiovascular: RRR, No MRG Respiratory: CTA B Gastrointestinal: abdomen soft, NT, ND, BS+ Musculoskeletal: no deformities, strength intact in all 4 Skin:  He has diffuse petechiae and ecchymosis,  Neurological: no tremor with outstretched hands, DTR normal in all 4  Results for orders placed or performed during the hospital encounter of  05/19/10  Surgical pcr screen  Result Value Ref Range   MRSA, PCR NEGATIVE NEGATIVE   Staphylococcus aureus  NEGATIVE    NEGATIVE        The Xpert SA Assay (FDA approved for NASAL specimens only), is one component of a comprehensive surveillance program.  It is not intended to diagnose infection nor to guide or monitor treatment.  Basic metabolic panel  Result Value Ref Range   Sodium 135 135 - 145 mEq/L   Potassium 3.9 3.5 - 5.1 mEq/L   Chloride 100 96 - 112 mEq/L   CO2 31 19 - 32 mEq/L   Glucose, Bld 100 (H) 70 - 99 mg/dL   BUN 12 6 - 23 mg/dL   Creatinine, Ser 0.82 0.4 - 1.5 mg/dL   Calcium 9.1 8.4 - 10.5 mg/dL   GFR calc non Af Amer >60 >60 mL/min   GFR calc Af Amer  >60 mL/min    >60        The eGFR has been calculated using the MDRD equation. This calculation has not been validated in all clinical situations. eGFR's persistently <60 mL/min signify possible Chronic Kidney Disease.  CBC  Result Value Ref Range   WBC 6.8 4.0 - 10.5 K/uL   RBC 4.07 (L) 4.22 - 5.81 MIL/uL   Hemoglobin 13.6 13.0 - 17.0 g/dL   HCT 40.6 39.0 - 52.0 %   MCV 99.8 78.0 - 100.0 fL   MCH 33.4 26.0 - 34.0 pg   MCHC 33.5 30.0 - 36.0 g/dL   RDW 13.7 11.5 - 15.5 %   Platelets 245 150 - 400 K/uL   Complete Blood Count (Most recent): Lab Results  Component Value Date   WBC 6.8 05/14/2010   HGB 13.6 05/14/2010   HCT 40.6 05/14/2010   MCV 99.8 05/14/2010   PLT 245 05/14/2010   Chemistry (most recent): Lab Results  Component Value Date   NA 135 05/14/2010   K 3.9 05/14/2010   CL 100 05/14/2010   CO2 31 05/14/2010   BUN 12 05/14/2010   CREATININE 0.82 05/14/2010    Thyroid ultrasound.05/19/2016 10:30 AM  CLINICAL HISTORY: 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 Echotexture is mildly heterogeneous, with some tiny less than 3 mm nodular areas in the lower pole. There is marked increased vascularity.  Left lobe: The left  lobe measures 4.2 x 1.3 x 1.7 cm. Echotexture is heterogeneous, with several tiny nodules, largest measures 5 mm in the lower pole. There is diffuse increased vascularity. Isthmus: The isthmus measures 4 mm. 6 mm isthmus nodule.  03/30/2016 labs: TSH 0.32, total T3 81, total T4 4.4   Assessment & Plan:   1. Abnormal thyroid blood test  Patient's history and most recent labs are reviewed. Findings are not  consistent with thyrotoxicosis   nor hypothyroidism at this time.   I like to repeat full profile thyroid function tests today and see patient in one week.  - If labs suggest hyperthyroidism, he will need confirmatory thyroid uptake and scan .  - He is thyroid ultrasound is unremarkable.  - he is extensively counseled against smoking.   - I advised patient to maintain close follow up with Glo Herring., MD for primary care needs.  Follow up plan: Return in about 1 week (around 06/02/2016) for labs today.  Glade Lloyd, MD Phone: [phone]  Fax: [phone]   05/26/2016, 2:50 PM

## 2016-05-27 LAB — T4, FREE: Free T4: 1.3 ng/dL (ref 0.82–1.77)

## 2016-05-27 LAB — T3, FREE: T3, Free: 4.2 pg/mL (ref 2.0–4.4)

## 2016-05-27 LAB — THYROGLOBULIN ANTIBODY: Thyroglobulin Antibody: <1 IU/mL (ref 0.0–0.9)

## 2016-05-27 LAB — TSH: TSH: 0.899 u[IU]/mL (ref 0.450–4.500)

## 2016-05-27 LAB — THYROID PEROXIDASE ANTIBODY: THYROID PEROXIDASE ANTIBODY: 18 [IU]/mL (ref 0–34)

## 2016-05-30 DIAGNOSIS — I6523 Occlusion and stenosis of bilateral carotid arteries: Secondary | ICD-10-CM | POA: Diagnosis not present

## 2016-05-30 DIAGNOSIS — I659 Occlusion and stenosis of unspecified precerebral artery: Secondary | ICD-10-CM | POA: Diagnosis not present

## 2016-05-30 DIAGNOSIS — I6529 Occlusion and stenosis of unspecified carotid artery: Secondary | ICD-10-CM | POA: Diagnosis not present

## 2016-06-06 ENCOUNTER — Encounter: Payer: Self-pay | Admitting: "Endocrinology

## 2016-06-06 ENCOUNTER — Ambulatory Visit (INDEPENDENT_AMBULATORY_CARE_PROVIDER_SITE_OTHER): Payer: Medicare Other | Admitting: "Endocrinology

## 2016-06-06 VITALS — BP 131/73 | HR 93 | Ht 72.0 in | Wt 179.0 lb

## 2016-06-06 DIAGNOSIS — R7989 Other specified abnormal findings of blood chemistry: Secondary | ICD-10-CM

## 2016-06-06 DIAGNOSIS — R946 Abnormal results of thyroid function studies: Secondary | ICD-10-CM | POA: Diagnosis not present

## 2016-06-06 NOTE — Progress Notes (Signed)
Subjective:    Patient ID: Dillon Carter, male    DOB: 1956/07/05, PCP Glo Herring., MD.   Past Medical History:  Diagnosis Date  . Anxiety   . Chronic back pain   . GERD (gastroesophageal reflux disease)   . Hepatitis C   . Hypertension   . Lung cancer ([place])   . Migraine   . Osteoarthritis   . Peripheral neuropathy ([place])   . Rheumatoid arthritis [facility])    Past Surgical History:  Procedure Laterality Date  . BACK SURGERY    . BRAIN SURGERY     Removed blood clot  . KNEE SURGERY    . LUNG SURGERY    . ROTATOR CUFF REPAIR     Social History   Social History  . Marital status: Single    Spouse name: N/A  . Number of children: 0  . Years of education: GED   Occupational History  . Disabled    Social History Main Topics  . Smoking status: Current Every Day Smoker    Packs/day: 1.00    Types: Cigarettes  . Smokeless tobacco: None  . Alcohol use 0.0 oz/week     Comment: Drinks at least 12 pack of beer per week - sometimes more.  . Drug use: No  . Sexual activity: Not Asked   Other Topics Concern  . None   Social History Narrative   Lives at home friend.   Right-handed.   1 cup caffeine per day.   Outpatient Encounter Prescriptions as of 06/06/2016  Medication Sig  . ALPRAZolam (XANAX) 1 MG tablet Take 1 mg by mouth.  Marland Kitchen amLODipine (NORVASC) 10 MG tablet Take 10 mg by mouth.  . enalapril (VASOTEC) 10 MG tablet Take 10 mg by mouth.  . hydrochlorothiazide (HYDRODIURIL) 25 MG tablet Take 25 mg by mouth.  Marland Kitchen HYDROcodone-acetaminophen (NORCO) 10-325 MG tablet Take by mouth.  . Omeprazole 20 MG TBEC Take by mouth.   No facility-administered encounter medications on file as of 06/06/2016.    ALLERGIES: Allergies  Allergen Reactions  . Codeine Rash   VACCINATION STATUS:  There is no immunization history on file for this patient.  HPI  The patient presents today with a medical history as above, and is being seen in f/u for Abnormal thyroid function  tests with repeat labs.    - His repeat thyroid function tests are within normal limits. - He denies heat or cold intolerance, palpitations, significant weight change .  - He underwent thyroid ultrasound which was reported to be unremarkable. He did not get thyroid uptake and scan.     The patient denies family history of thyroid dysfunction   in his family,  and the patient denies personal history of goiter. Patient is not on any anti-thyroid medications nor on any thyroid hormone supplements. Patient  is willing to proceed with appropriate work up and therapy for thyrotoxicosis.   Review of Systems  Constitutional: no recent  weight change, + fatigue, no subjective hyperthermia/hypothermia Eyes: no blurry vision, no xerophthalmia ENT: no sore throat, no nodules palpated in throat, no dysphagia/odynophagia, no hoarseness Cardiovascular: no CP/SOB/palpitations/leg swelling Respiratory: no cough/SOB Gastrointestinal: no N/V/D/C Musculoskeletal: no muscle/joint aches Skin: + rashes Neurological: no tremors/numbness/tingling/dizziness Psychiatric:  + depression, +anxiety  Objective:    BP 131/73   Pulse 93   Ht 6' (1.829 m)   Wt 179 lb (81.2 kg)   BMI 24.28 kg/m   Wt Readings from Last 3 Encounters:  06/06/16 179  lb (81.2 kg)  05/26/16 175 lb (79.4 kg)  11/18/15 172 lb 12 oz (78.4 kg)    Physical Exam Constitutional: in NAD Eyes: PERRLA, EOMI, no exophthalmos ENT: moist mucous membranes, no thyromegaly, no cervical lymphadenopathy Cardiovascular: RRR, No MRG Respiratory: CTA B Gastrointestinal: abdomen soft, NT, ND, BS+ Musculoskeletal: no deformities, strength intact in all 4 Skin:  He has diffuse petechiae and ecchymosis,  Neurological: no tremor with outstretched hands, DTR normal in all 4  Results for orders placed or performed in visit on 05/26/16  T4, free  Result Value Ref Range   Free T4 1.30 0.82 - 1.77 ng/dL  TSH  Result Value Ref Range   TSH 0.899 0.450 -  4.500 uIU/mL  Thyroglobulin antibody  Result Value Ref Range   Thyroglobulin Antibody <1.0 0.0 - 0.9 IU/mL  Thyroid peroxidase antibody  Result Value Ref Range   Thyroperoxidase Ab SerPl-aCnc 18 0 - 34 IU/mL  T3, free  Result Value Ref Range   T3, Free 4.2 2.0 - 4.4 pg/mL   Complete Blood Count (Most recent): Lab Results  Component Value Date   WBC 6.8 05/14/2010   HGB 13.6 05/14/2010   HCT 40.6 05/14/2010   MCV 99.8 05/14/2010   PLT 245 05/14/2010   Chemistry (most recent): Lab Results  Component Value Date   NA 135 05/14/2010   K 3.9 05/14/2010   CL 100 05/14/2010   CO2 31 05/14/2010   BUN 12 05/14/2010   CREATININE 0.82 05/14/2010    Thyroid ultrasound.05/19/2016 10:30 AM  CLINICAL HISTORY: 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 Echotexture is mildly heterogeneous, with some tiny less than 3 mm nodular areas in the lower pole. There is marked increased vascularity.  Left lobe: The left lobe measures 4.2 x 1.3 x 1.7 cm. Echotexture is heterogeneous, with several tiny nodules, largest measures 5 mm in the lower pole. There is diffuse increased vascularity. Isthmus: The isthmus measures 4 mm. 6 mm isthmus nodule.  03/30/2016 labs: TSH 0.32, total T3 81, total T4 4.4   Assessment & Plan:   1. Abnormal thyroid blood test  Patient's history and most recent labs are reviewed. Findings are consistent with Euthyroid state/normally  functioning thyroid. - His thyroid ultrasound is unremarkable.  - He is extensively counseled against smoking.   - I advised patient to maintain close follow up with Glo Herring., MD for primary care needs.  Follow up plan: Return in about 6 months (around 12/05/2016) for follow up with pre-visit labs.  Glade Lloyd, MD Phone: [phone]  Fax: [phone]   06/06/2016, 3:38 PM

## 2016-06-16 DIAGNOSIS — G894 Chronic pain syndrome: Secondary | ICD-10-CM | POA: Diagnosis not present

## 2016-06-16 DIAGNOSIS — E063 Autoimmune thyroiditis: Secondary | ICD-10-CM | POA: Diagnosis not present

## 2016-06-16 DIAGNOSIS — M1991 Primary osteoarthritis, unspecified site: Secondary | ICD-10-CM | POA: Diagnosis not present

## 2016-07-06 DIAGNOSIS — Z0000 Encounter for general adult medical examination without abnormal findings: Secondary | ICD-10-CM | POA: Diagnosis not present

## 2016-07-27 DIAGNOSIS — D692 Other nonthrombocytopenic purpura: Secondary | ICD-10-CM | POA: Diagnosis not present

## 2016-07-27 DIAGNOSIS — L2084 Intrinsic (allergic) eczema: Secondary | ICD-10-CM | POA: Diagnosis not present

## 2016-09-09 DIAGNOSIS — G894 Chronic pain syndrome: Secondary | ICD-10-CM | POA: Diagnosis not present

## 2016-09-09 DIAGNOSIS — E063 Autoimmune thyroiditis: Secondary | ICD-10-CM | POA: Diagnosis not present

## 2016-09-09 DIAGNOSIS — I10 Essential (primary) hypertension: Secondary | ICD-10-CM | POA: Diagnosis not present

## 2016-09-09 DIAGNOSIS — M2550 Pain in unspecified joint: Secondary | ICD-10-CM | POA: Diagnosis not present

## 2016-09-09 DIAGNOSIS — M1991 Primary osteoarthritis, unspecified site: Secondary | ICD-10-CM | POA: Diagnosis not present

## 2016-12-09 DIAGNOSIS — Z1389 Encounter for screening for other disorder: Secondary | ICD-10-CM | POA: Diagnosis not present

## 2016-12-09 DIAGNOSIS — G894 Chronic pain syndrome: Secondary | ICD-10-CM | POA: Diagnosis not present

## 2016-12-09 DIAGNOSIS — M2550 Pain in unspecified joint: Secondary | ICD-10-CM | POA: Diagnosis not present

## 2016-12-09 DIAGNOSIS — M1991 Primary osteoarthritis, unspecified site: Secondary | ICD-10-CM | POA: Diagnosis not present

## 2017-03-06 DIAGNOSIS — G894 Chronic pain syndrome: Secondary | ICD-10-CM | POA: Diagnosis not present

## 2017-04-05 DIAGNOSIS — E063 Autoimmune thyroiditis: Secondary | ICD-10-CM | POA: Diagnosis not present

## 2017-04-05 DIAGNOSIS — G894 Chronic pain syndrome: Secondary | ICD-10-CM | POA: Diagnosis not present

## 2017-05-03 DIAGNOSIS — E063 Autoimmune thyroiditis: Secondary | ICD-10-CM | POA: Diagnosis not present

## 2017-05-03 DIAGNOSIS — G894 Chronic pain syndrome: Secondary | ICD-10-CM | POA: Diagnosis not present

## 2017-05-03 DIAGNOSIS — M5412 Radiculopathy, cervical region: Secondary | ICD-10-CM | POA: Diagnosis not present

## 2017-05-03 DIAGNOSIS — Z1389 Encounter for screening for other disorder: Secondary | ICD-10-CM | POA: Diagnosis not present

## 2017-05-03 DIAGNOSIS — I10 Essential (primary) hypertension: Secondary | ICD-10-CM | POA: Diagnosis not present

## 2017-05-03 DIAGNOSIS — M47812 Spondylosis without myelopathy or radiculopathy, cervical region: Secondary | ICD-10-CM | POA: Diagnosis not present

## 2017-05-04 DIAGNOSIS — E748 Other specified disorders of carbohydrate metabolism: Secondary | ICD-10-CM | POA: Diagnosis not present

## 2017-07-17 DIAGNOSIS — Z0001 Encounter for general adult medical examination with abnormal findings: Secondary | ICD-10-CM | POA: Diagnosis not present

## 2017-07-17 DIAGNOSIS — D692 Other nonthrombocytopenic purpura: Secondary | ICD-10-CM | POA: Diagnosis not present

## 2017-07-17 DIAGNOSIS — Z1389 Encounter for screening for other disorder: Secondary | ICD-10-CM | POA: Diagnosis not present

## 2017-10-09 DIAGNOSIS — M1991 Primary osteoarthritis, unspecified site: Secondary | ICD-10-CM | POA: Diagnosis not present

## 2017-10-09 DIAGNOSIS — I10 Essential (primary) hypertension: Secondary | ICD-10-CM | POA: Diagnosis not present

## 2017-10-09 DIAGNOSIS — E063 Autoimmune thyroiditis: Secondary | ICD-10-CM | POA: Diagnosis not present

## 2017-10-09 DIAGNOSIS — Z1389 Encounter for screening for other disorder: Secondary | ICD-10-CM | POA: Diagnosis not present

## 2017-12-27 DIAGNOSIS — Z6823 Body mass index (BMI) 23.0-23.9, adult: Secondary | ICD-10-CM | POA: Diagnosis not present

## 2017-12-27 DIAGNOSIS — G894 Chronic pain syndrome: Secondary | ICD-10-CM | POA: Diagnosis not present

## 2018-02-16 DIAGNOSIS — R6520 Severe sepsis without septic shock: Secondary | ICD-10-CM | POA: Diagnosis not present

## 2018-02-16 DIAGNOSIS — E43 Unspecified severe protein-calorie malnutrition: Secondary | ICD-10-CM | POA: Diagnosis not present

## 2018-02-16 DIAGNOSIS — R633 Feeding difficulties: Secondary | ICD-10-CM | POA: Diagnosis not present

## 2018-02-16 DIAGNOSIS — R1111 Vomiting without nausea: Secondary | ICD-10-CM | POA: Diagnosis not present

## 2018-02-16 DIAGNOSIS — K219 Gastro-esophageal reflux disease without esophagitis: Secondary | ICD-10-CM | POA: Diagnosis not present

## 2018-02-16 DIAGNOSIS — L89014 Pressure ulcer of right elbow, stage 4: Secondary | ICD-10-CM | POA: Diagnosis not present

## 2018-02-16 DIAGNOSIS — W19XXXA Unspecified fall, initial encounter: Secondary | ICD-10-CM | POA: Diagnosis not present

## 2018-02-16 DIAGNOSIS — I459 Conduction disorder, unspecified: Secondary | ICD-10-CM | POA: Diagnosis not present

## 2018-02-16 DIAGNOSIS — L8945 Pressure ulcer of contiguous site of back, buttock and hip, unstageable: Secondary | ICD-10-CM | POA: Diagnosis not present

## 2018-02-16 DIAGNOSIS — G9349 Other encephalopathy: Secondary | ICD-10-CM | POA: Diagnosis not present

## 2018-02-16 DIAGNOSIS — B964 Proteus (mirabilis) (morganii) as the cause of diseases classified elsewhere: Secondary | ICD-10-CM | POA: Diagnosis not present

## 2018-02-16 DIAGNOSIS — E876 Hypokalemia: Secondary | ICD-10-CM | POA: Diagnosis not present

## 2018-02-16 DIAGNOSIS — E440 Moderate protein-calorie malnutrition: Secondary | ICD-10-CM | POA: Diagnosis not present

## 2018-02-16 DIAGNOSIS — E860 Dehydration: Secondary | ICD-10-CM | POA: Diagnosis not present

## 2018-02-16 DIAGNOSIS — L8943 Pressure ulcer of contiguous site of back, buttock and hip, stage 3: Secondary | ICD-10-CM | POA: Diagnosis not present

## 2018-02-16 DIAGNOSIS — L89890 Pressure ulcer of other site, unstageable: Secondary | ICD-10-CM | POA: Diagnosis not present

## 2018-02-16 DIAGNOSIS — L89620 Pressure ulcer of left heel, unstageable: Secondary | ICD-10-CM | POA: Diagnosis not present

## 2018-02-16 DIAGNOSIS — R5381 Other malaise: Secondary | ICD-10-CM | POA: Diagnosis not present

## 2018-02-16 DIAGNOSIS — R609 Edema, unspecified: Secondary | ICD-10-CM | POA: Diagnosis not present

## 2018-02-16 DIAGNOSIS — R402441 Other coma, without documented Glasgow coma scale score, or with partial score reported, in the field [EMT or ambulance]: Secondary | ICD-10-CM | POA: Diagnosis not present

## 2018-02-16 DIAGNOSIS — I808 Phlebitis and thrombophlebitis of other sites: Secondary | ICD-10-CM | POA: Diagnosis not present

## 2018-02-16 DIAGNOSIS — E119 Type 2 diabetes mellitus without complications: Secondary | ICD-10-CM | POA: Diagnosis not present

## 2018-02-16 DIAGNOSIS — R2689 Other abnormalities of gait and mobility: Secondary | ICD-10-CM | POA: Diagnosis not present

## 2018-02-16 DIAGNOSIS — R402411 Glasgow coma scale score 13-15, in the field [EMT or ambulance]: Secondary | ICD-10-CM | POA: Diagnosis not present

## 2018-02-16 DIAGNOSIS — R9431 Abnormal electrocardiogram [ECG] [EKG]: Secondary | ICD-10-CM | POA: Diagnosis not present

## 2018-02-16 DIAGNOSIS — R404 Transient alteration of awareness: Secondary | ICD-10-CM | POA: Diagnosis not present

## 2018-02-16 DIAGNOSIS — E872 Acidosis: Secondary | ICD-10-CM | POA: Diagnosis not present

## 2018-02-16 DIAGNOSIS — I491 Atrial premature depolarization: Secondary | ICD-10-CM | POA: Diagnosis not present

## 2018-02-16 DIAGNOSIS — R54 Age-related physical debility: Secondary | ICD-10-CM | POA: Diagnosis not present

## 2018-02-16 DIAGNOSIS — R1313 Dysphagia, pharyngeal phase: Secondary | ICD-10-CM | POA: Diagnosis not present

## 2018-02-16 DIAGNOSIS — E871 Hypo-osmolality and hyponatremia: Secondary | ICD-10-CM | POA: Diagnosis not present

## 2018-02-16 DIAGNOSIS — A411 Sepsis due to other specified staphylococcus: Secondary | ICD-10-CM | POA: Diagnosis not present

## 2018-02-16 DIAGNOSIS — M6281 Muscle weakness (generalized): Secondary | ICD-10-CM | POA: Diagnosis not present

## 2018-02-16 DIAGNOSIS — L89010 Pressure ulcer of right elbow, unstageable: Secondary | ICD-10-CM | POA: Diagnosis not present

## 2018-02-16 DIAGNOSIS — Z743 Need for continuous supervision: Secondary | ICD-10-CM | POA: Diagnosis not present

## 2018-02-16 DIAGNOSIS — R55 Syncope and collapse: Secondary | ICD-10-CM | POA: Diagnosis not present

## 2018-02-16 DIAGNOSIS — R0902 Hypoxemia: Secondary | ICD-10-CM | POA: Diagnosis not present

## 2018-02-16 DIAGNOSIS — L89023 Pressure ulcer of left elbow, stage 3: Secondary | ICD-10-CM | POA: Diagnosis not present

## 2018-02-16 DIAGNOSIS — R1310 Dysphagia, unspecified: Secondary | ICD-10-CM | POA: Diagnosis not present

## 2018-02-16 DIAGNOSIS — I639 Cerebral infarction, unspecified: Secondary | ICD-10-CM | POA: Diagnosis not present

## 2018-02-16 DIAGNOSIS — I10 Essential (primary) hypertension: Secondary | ICD-10-CM | POA: Diagnosis not present

## 2018-02-16 DIAGNOSIS — L89120 Pressure ulcer of left upper back, unstageable: Secondary | ICD-10-CM | POA: Diagnosis not present

## 2018-02-16 DIAGNOSIS — A419 Sepsis, unspecified organism: Secondary | ICD-10-CM | POA: Diagnosis not present

## 2018-02-16 DIAGNOSIS — L89610 Pressure ulcer of right heel, unstageable: Secondary | ICD-10-CM | POA: Diagnosis not present

## 2018-02-16 DIAGNOSIS — L89129 Pressure ulcer of left upper back, unspecified stage: Secondary | ICD-10-CM | POA: Diagnosis not present

## 2018-02-16 DIAGNOSIS — Z85118 Personal history of other malignant neoplasm of bronchus and lung: Secondary | ICD-10-CM | POA: Diagnosis not present

## 2018-02-16 DIAGNOSIS — R7881 Bacteremia: Secondary | ICD-10-CM | POA: Diagnosis not present

## 2018-02-16 DIAGNOSIS — R918 Other nonspecific abnormal finding of lung field: Secondary | ICD-10-CM | POA: Diagnosis not present

## 2018-02-16 DIAGNOSIS — L89110 Pressure ulcer of right upper back, unstageable: Secondary | ICD-10-CM | POA: Diagnosis not present

## 2018-02-16 DIAGNOSIS — G9340 Encephalopathy, unspecified: Secondary | ICD-10-CM | POA: Diagnosis not present

## 2018-02-16 DIAGNOSIS — R000 Tachycardia, unspecified: Secondary | ICD-10-CM | POA: Diagnosis not present

## 2018-02-16 DIAGNOSIS — L89150 Pressure ulcer of sacral region, unstageable: Secondary | ICD-10-CM | POA: Diagnosis not present

## 2018-02-16 DIAGNOSIS — G9341 Metabolic encephalopathy: Secondary | ICD-10-CM | POA: Diagnosis not present

## 2018-02-16 DIAGNOSIS — B957 Other staphylococcus as the cause of diseases classified elsewhere: Secondary | ICD-10-CM | POA: Diagnosis not present

## 2018-02-16 DIAGNOSIS — J387 Other diseases of larynx: Secondary | ICD-10-CM | POA: Diagnosis not present

## 2018-02-16 DIAGNOSIS — L89220 Pressure ulcer of left hip, unstageable: Secondary | ICD-10-CM | POA: Diagnosis not present

## 2018-03-15 DIAGNOSIS — L89620 Pressure ulcer of left heel, unstageable: Secondary | ICD-10-CM | POA: Diagnosis not present

## 2018-03-15 DIAGNOSIS — L97829 Non-pressure chronic ulcer of other part of left lower leg with unspecified severity: Secondary | ICD-10-CM | POA: Diagnosis not present

## 2018-03-15 DIAGNOSIS — L89614 Pressure ulcer of right heel, stage 4: Secondary | ICD-10-CM | POA: Diagnosis not present

## 2018-03-15 DIAGNOSIS — Z85118 Personal history of other malignant neoplasm of bronchus and lung: Secondary | ICD-10-CM | POA: Diagnosis not present

## 2018-03-15 DIAGNOSIS — L98419 Non-pressure chronic ulcer of buttock with unspecified severity: Secondary | ICD-10-CM | POA: Diagnosis not present

## 2018-03-15 DIAGNOSIS — G9341 Metabolic encephalopathy: Secondary | ICD-10-CM | POA: Diagnosis not present

## 2018-03-15 DIAGNOSIS — R54 Age-related physical debility: Secondary | ICD-10-CM | POA: Diagnosis not present

## 2018-03-15 DIAGNOSIS — L89123 Pressure ulcer of left upper back, stage 3: Secondary | ICD-10-CM | POA: Diagnosis not present

## 2018-03-15 DIAGNOSIS — R2689 Other abnormalities of gait and mobility: Secondary | ICD-10-CM | POA: Diagnosis not present

## 2018-03-15 DIAGNOSIS — M6281 Muscle weakness (generalized): Secondary | ICD-10-CM | POA: Diagnosis not present

## 2018-03-15 DIAGNOSIS — Z743 Need for continuous supervision: Secondary | ICD-10-CM | POA: Diagnosis not present

## 2018-03-15 DIAGNOSIS — L89610 Pressure ulcer of right heel, unstageable: Secondary | ICD-10-CM | POA: Diagnosis not present

## 2018-03-15 DIAGNOSIS — L89013 Pressure ulcer of right elbow, stage 3: Secondary | ICD-10-CM | POA: Diagnosis not present

## 2018-03-15 DIAGNOSIS — L8943 Pressure ulcer of contiguous site of back, buttock and hip, stage 3: Secondary | ICD-10-CM | POA: Diagnosis not present

## 2018-03-15 DIAGNOSIS — I10 Essential (primary) hypertension: Secondary | ICD-10-CM | POA: Diagnosis not present

## 2018-03-15 DIAGNOSIS — R52 Pain, unspecified: Secondary | ICD-10-CM | POA: Diagnosis not present

## 2018-03-15 DIAGNOSIS — R5381 Other malaise: Secondary | ICD-10-CM | POA: Diagnosis not present

## 2018-03-15 DIAGNOSIS — L89623 Pressure ulcer of left heel, stage 3: Secondary | ICD-10-CM | POA: Diagnosis not present

## 2018-03-15 DIAGNOSIS — G9349 Other encephalopathy: Secondary | ICD-10-CM | POA: Diagnosis not present

## 2018-03-15 DIAGNOSIS — R1313 Dysphagia, pharyngeal phase: Secondary | ICD-10-CM | POA: Diagnosis not present

## 2018-03-15 DIAGNOSIS — E871 Hypo-osmolality and hyponatremia: Secondary | ICD-10-CM | POA: Diagnosis not present

## 2018-03-15 DIAGNOSIS — E43 Unspecified severe protein-calorie malnutrition: Secondary | ICD-10-CM | POA: Diagnosis not present

## 2018-03-15 DIAGNOSIS — L89154 Pressure ulcer of sacral region, stage 4: Secondary | ICD-10-CM | POA: Diagnosis not present

## 2018-03-15 DIAGNOSIS — A419 Sepsis, unspecified organism: Secondary | ICD-10-CM | POA: Diagnosis not present

## 2018-03-15 DIAGNOSIS — L89893 Pressure ulcer of other site, stage 3: Secondary | ICD-10-CM | POA: Diagnosis not present

## 2018-03-15 DIAGNOSIS — T07XXXA Unspecified multiple injuries, initial encounter: Secondary | ICD-10-CM | POA: Diagnosis not present

## 2018-03-15 DIAGNOSIS — R7881 Bacteremia: Secondary | ICD-10-CM | POA: Diagnosis not present

## 2018-03-20 DIAGNOSIS — Z85118 Personal history of other malignant neoplasm of bronchus and lung: Secondary | ICD-10-CM | POA: Diagnosis not present

## 2018-03-20 DIAGNOSIS — I10 Essential (primary) hypertension: Secondary | ICD-10-CM | POA: Diagnosis not present

## 2018-03-20 DIAGNOSIS — G9341 Metabolic encephalopathy: Secondary | ICD-10-CM | POA: Diagnosis not present

## 2018-03-21 DIAGNOSIS — L89620 Pressure ulcer of left heel, unstageable: Secondary | ICD-10-CM | POA: Diagnosis not present

## 2018-03-21 DIAGNOSIS — L89154 Pressure ulcer of sacral region, stage 4: Secondary | ICD-10-CM | POA: Diagnosis not present

## 2018-03-27 DIAGNOSIS — L89013 Pressure ulcer of right elbow, stage 3: Secondary | ICD-10-CM | POA: Diagnosis not present

## 2018-03-27 DIAGNOSIS — L89123 Pressure ulcer of left upper back, stage 3: Secondary | ICD-10-CM | POA: Diagnosis not present

## 2018-03-27 DIAGNOSIS — L98419 Non-pressure chronic ulcer of buttock with unspecified severity: Secondary | ICD-10-CM | POA: Diagnosis not present

## 2018-03-28 DIAGNOSIS — T07XXXA Unspecified multiple injuries, initial encounter: Secondary | ICD-10-CM | POA: Diagnosis not present

## 2018-03-28 DIAGNOSIS — R52 Pain, unspecified: Secondary | ICD-10-CM | POA: Diagnosis not present

## 2018-04-03 DIAGNOSIS — L97829 Non-pressure chronic ulcer of other part of left lower leg with unspecified severity: Secondary | ICD-10-CM | POA: Diagnosis not present

## 2018-04-03 DIAGNOSIS — L89123 Pressure ulcer of left upper back, stage 3: Secondary | ICD-10-CM | POA: Diagnosis not present

## 2018-04-03 DIAGNOSIS — L89013 Pressure ulcer of right elbow, stage 3: Secondary | ICD-10-CM | POA: Diagnosis not present

## 2018-04-04 DIAGNOSIS — R52 Pain, unspecified: Secondary | ICD-10-CM | POA: Diagnosis not present

## 2018-04-04 DIAGNOSIS — R5381 Other malaise: Secondary | ICD-10-CM | POA: Diagnosis not present

## 2018-04-04 DIAGNOSIS — T07XXXA Unspecified multiple injuries, initial encounter: Secondary | ICD-10-CM | POA: Diagnosis not present

## 2018-04-10 DIAGNOSIS — R52 Pain, unspecified: Secondary | ICD-10-CM | POA: Diagnosis not present

## 2018-04-10 DIAGNOSIS — T07XXXA Unspecified multiple injuries, initial encounter: Secondary | ICD-10-CM | POA: Diagnosis not present

## 2018-04-10 DIAGNOSIS — L89123 Pressure ulcer of left upper back, stage 3: Secondary | ICD-10-CM | POA: Diagnosis not present

## 2018-04-10 DIAGNOSIS — L89893 Pressure ulcer of other site, stage 3: Secondary | ICD-10-CM | POA: Diagnosis not present

## 2018-04-10 DIAGNOSIS — L97829 Non-pressure chronic ulcer of other part of left lower leg with unspecified severity: Secondary | ICD-10-CM | POA: Diagnosis not present

## 2018-04-10 DIAGNOSIS — G9341 Metabolic encephalopathy: Secondary | ICD-10-CM | POA: Diagnosis not present

## 2018-04-15 DIAGNOSIS — I10 Essential (primary) hypertension: Secondary | ICD-10-CM | POA: Diagnosis not present

## 2018-04-15 DIAGNOSIS — F339 Major depressive disorder, recurrent, unspecified: Secondary | ICD-10-CM | POA: Diagnosis not present

## 2018-04-15 DIAGNOSIS — L98499 Non-pressure chronic ulcer of skin of other sites with unspecified severity: Secondary | ICD-10-CM | POA: Diagnosis not present

## 2018-04-15 DIAGNOSIS — R7881 Bacteremia: Secondary | ICD-10-CM | POA: Diagnosis not present

## 2018-04-15 DIAGNOSIS — L89614 Pressure ulcer of right heel, stage 4: Secondary | ICD-10-CM | POA: Diagnosis not present

## 2018-04-15 DIAGNOSIS — R52 Pain, unspecified: Secondary | ICD-10-CM | POA: Diagnosis not present

## 2018-04-15 DIAGNOSIS — G9349 Other encephalopathy: Secondary | ICD-10-CM | POA: Diagnosis not present

## 2018-04-15 DIAGNOSIS — L89154 Pressure ulcer of sacral region, stage 4: Secondary | ICD-10-CM | POA: Diagnosis not present

## 2018-04-15 DIAGNOSIS — R1313 Dysphagia, pharyngeal phase: Secondary | ICD-10-CM | POA: Diagnosis not present

## 2018-04-15 DIAGNOSIS — R41841 Cognitive communication deficit: Secondary | ICD-10-CM | POA: Diagnosis not present

## 2018-04-15 DIAGNOSIS — L8943 Pressure ulcer of contiguous site of back, buttock and hip, stage 3: Secondary | ICD-10-CM | POA: Diagnosis not present

## 2018-04-15 DIAGNOSIS — M6281 Muscle weakness (generalized): Secondary | ICD-10-CM | POA: Diagnosis not present

## 2018-04-15 DIAGNOSIS — R54 Age-related physical debility: Secondary | ICD-10-CM | POA: Diagnosis not present

## 2018-04-15 DIAGNOSIS — L98419 Non-pressure chronic ulcer of buttock with unspecified severity: Secondary | ICD-10-CM | POA: Diagnosis not present

## 2018-04-15 DIAGNOSIS — R55 Syncope and collapse: Secondary | ICD-10-CM | POA: Diagnosis not present

## 2018-04-15 DIAGNOSIS — E46 Unspecified protein-calorie malnutrition: Secondary | ICD-10-CM | POA: Diagnosis not present

## 2018-04-15 DIAGNOSIS — L89624 Pressure ulcer of left heel, stage 4: Secondary | ICD-10-CM | POA: Diagnosis not present

## 2018-04-19 DIAGNOSIS — L89624 Pressure ulcer of left heel, stage 4: Secondary | ICD-10-CM | POA: Diagnosis not present

## 2018-04-19 DIAGNOSIS — L89614 Pressure ulcer of right heel, stage 4: Secondary | ICD-10-CM | POA: Diagnosis not present

## 2018-04-24 DIAGNOSIS — R55 Syncope and collapse: Secondary | ICD-10-CM | POA: Diagnosis not present

## 2018-04-25 DIAGNOSIS — L98499 Non-pressure chronic ulcer of skin of other sites with unspecified severity: Secondary | ICD-10-CM | POA: Diagnosis not present

## 2018-04-25 DIAGNOSIS — L89624 Pressure ulcer of left heel, stage 4: Secondary | ICD-10-CM | POA: Diagnosis not present

## 2018-05-01 DIAGNOSIS — I10 Essential (primary) hypertension: Secondary | ICD-10-CM | POA: Diagnosis not present

## 2018-05-01 DIAGNOSIS — R52 Pain, unspecified: Secondary | ICD-10-CM | POA: Diagnosis not present

## 2018-05-01 DIAGNOSIS — E46 Unspecified protein-calorie malnutrition: Secondary | ICD-10-CM | POA: Diagnosis not present

## 2018-05-02 DIAGNOSIS — L98419 Non-pressure chronic ulcer of buttock with unspecified severity: Secondary | ICD-10-CM | POA: Diagnosis not present

## 2018-05-02 DIAGNOSIS — L89624 Pressure ulcer of left heel, stage 4: Secondary | ICD-10-CM | POA: Diagnosis not present

## 2018-05-03 ENCOUNTER — Other Ambulatory Visit (HOSPITAL_COMMUNITY): Payer: Self-pay | Admitting: Specialist

## 2018-05-03 DIAGNOSIS — R1319 Other dysphagia: Secondary | ICD-10-CM

## 2018-05-09 DIAGNOSIS — L89624 Pressure ulcer of left heel, stage 4: Secondary | ICD-10-CM | POA: Diagnosis not present

## 2018-05-09 DIAGNOSIS — L89154 Pressure ulcer of sacral region, stage 4: Secondary | ICD-10-CM | POA: Diagnosis not present

## 2018-05-15 ENCOUNTER — Other Ambulatory Visit (HOSPITAL_COMMUNITY): Payer: Self-pay | Admitting: Internal Medicine

## 2018-05-15 ENCOUNTER — Other Ambulatory Visit: Payer: Self-pay

## 2018-05-15 ENCOUNTER — Encounter (HOSPITAL_COMMUNITY): Payer: Self-pay | Admitting: Speech Pathology

## 2018-05-15 ENCOUNTER — Ambulatory Visit (HOSPITAL_COMMUNITY)
Admission: RE | Admit: 2018-05-15 | Discharge: 2018-05-15 | Disposition: A | Discharge status: Home / Self Care | Payer: Medicare HMO | Source: Ambulatory Visit | Attending: Internal Medicine | Admitting: Internal Medicine

## 2018-05-15 ENCOUNTER — Ambulatory Visit (HOSPITAL_COMMUNITY): Payer: Medicare HMO | Attending: Internal Medicine | Admitting: Speech Pathology

## 2018-05-15 DIAGNOSIS — R1312 Dysphagia, oropharyngeal phase: Secondary | ICD-10-CM | POA: Diagnosis not present

## 2018-05-15 DIAGNOSIS — R1319 Other dysphagia: Secondary | ICD-10-CM | POA: Insufficient documentation

## 2018-05-15 DIAGNOSIS — F329 Major depressive disorder, single episode, unspecified: Secondary | ICD-10-CM | POA: Diagnosis not present

## 2018-05-15 DIAGNOSIS — R1310 Dysphagia, unspecified: Secondary | ICD-10-CM | POA: Diagnosis not present

## 2018-05-15 DIAGNOSIS — R05 Cough: Secondary | ICD-10-CM | POA: Diagnosis not present

## 2018-05-15 DIAGNOSIS — G9340 Encephalopathy, unspecified: Secondary | ICD-10-CM | POA: Insufficient documentation

## 2018-05-15 NOTE — Therapy (Signed)
[place], [place], [address] Phone: [phone]   Fax:  [phone]  Modified Barium Swallow  Patient Details  Name: Dillon Carter MRN: [id number] Date of Birth: 1956/07/21 No data recorded  Encounter Date: 05/15/2018    Past Medical History:  Diagnosis Date  . Anxiety   . Chronic back pain   . GERD (gastroesophageal reflux disease)   . Hepatitis C   . Hypertension   . Lung cancer ([place])   . Migraine   . Osteoarthritis   . Peripheral neuropathy   . Rheumatoid arthritis [facility])     Past Surgical History:  Procedure Laterality Date  . BACK SURGERY    . BRAIN SURGERY     Removed blood clot  . KNEE SURGERY    . LUNG SURGERY    . ROTATOR CUFF REPAIR      There were no vitals filed for this visit.  Subjective Assessment - 05/15/18 1726    Subjective  "They say I have a problem swallowing, but I don't think I do."    Special Tests  MBSS    Currently in Pain?  Yes    Pain Location  Buttocks          General - 05/15/18 1727      General Information   Date of Onset  04/23/18    HPI  Dillon Carter is a 62 year old Caucasian male with a past medical history of lung cancer status post left lower lobe resection, HCV (VL undetected), GERD, HTN, prior trauma with skull fx and cervical spinal fusion who was brought to ER with decreased LOC found down at home for > 4-5 days admitted with a lactic acidosis and acute encephalopathy with severe sepsis. Upon further evaluation the patient is noted to have possible depression after losing his partner with a reported history of possible exposure to HIV (HIV non-reactive). It is also noted he has a pertinent history of alcohol use. Pt was admitted to [facility] in July for the above and was discharged to [facility] 03/14/2018. Pt was discharged to [facility] on puree diet with NTL and on Krupp water protocol. Current diet is unknown. Pt referred for MBSS by Dr. Caprice Renshaw.    Type  of Study  MBS-Modified Barium Swallow Study    Diet Prior to this Study  Information not available    Temperature Spikes Noted  No    Respiratory Status  Room air    History of Recent Intubation  No    Behavior/Cognition  Alert;Cooperative    Oral Cavity Assessment  Within Functional Limits    Oral Care Completed by SLP  No    Oral Cavity - Dentition  Poor condition    Vision  Functional for self feeding    Self-Feeding Abilities  Able to feed self    Patient Positioning  Upright in chair    Baseline Vocal Quality  Normal;Breathy    Volitional Cough  Strong    Volitional Swallow  Able to elicit    Anatomy  Within functional limits   cervical hardware C5-7   Pharyngeal Secretions  Not observed secondary MBS         Oral Preparation/Oral Phase - 05/15/18 1728      Oral Preparation/Oral Phase   Oral Phase  Within functional limits      Electrical stimulation - Oral Phase   Was Electrical Stimulation Used  No       Pharyngeal Phase -  05/15/18 1728      Pharyngeal Phase   Pharyngeal Phase  Impaired      Pharyngeal - Nectar   Pharyngeal- Nectar Cup  Delayed swallow initiation;Swallow initiation at pyriform sinus;Pharyngeal residue - valleculae      Pharyngeal - Thin   Pharyngeal- Thin Teaspoon  Swallow initiation at pyriform sinus;Penetration/Aspiration during swallow;Pharyngeal residue - valleculae;Pharyngeal residue - pyriform;Reduced epiglottic inversion;Delayed swallow initiation    Pharyngeal  Material enters airway, remains ABOVE vocal cords then ejected out    Pharyngeal- Thin Cup  Delayed swallow initiation;Swallow initiation at pyriform sinus;Reduced epiglottic inversion;Penetration/Aspiration during swallow;Reduced airway/laryngeal closure    Pharyngeal  Material does not enter airway;Material enters airway, CONTACTS cords and not ejected out    Pharyngeal- Thin Straw  Delayed swallow initiation;Swallow initiation at pyriform sinus;Penetration/Aspiration during  swallow    Pharyngeal  Material enters airway, CONTACTS cords and not ejected out      Pharyngeal - Solids   Pharyngeal- Puree  Swallow initiation at vallecula    Pharyngeal- Regular  Delayed swallow initiation-vallecula;Pharyngeal residue - valleculae;Reduced tongue base retraction    Pharyngeal- Pill  Swallow initiation at pyriform sinus;Reduced airway/laryngeal closure;Reduced tongue base retraction;Penetration/Aspiration before swallow;Moderate aspiration    Pharyngeal  Material enters airway, passes BELOW cords and not ejected out despite cough attempt by patient      Electrical Stimulation - Pharyngeal Phase   Was Electrical Stimulation Used  No       Cricopharyngeal Phase - 05/15/18 1733      Cervical Esophageal Phase   Cervical Esophageal Phase  Within functional limits       Plan - 05/15/18 1734    Clinical Impression Statement  Pt presents with moderate pharyngeal phase dysphagia characterized by delay in swallow initiation with swallow trigger after filling the pyriforms with liquids, reduced tongue base retraction and epiglottic deflection resulting in penetration during the swallow with tsp presentation thins and min vallecular and pyriform residue which clears with repeat swallow, penetration to vocal cords with large cup sips and straw sips and not removed despite cue to cough, and aspiration of thins before the swallow when taking barium tablet with cup thin barium which was not removed despite strong cough. Chin tuck was implemented with thins with good mitigation of penetration and aspiration when taking small sips and cue to hold liquid in mouth prior to chin to chest. He was able to replicate this on two trials. Recommend regular textures (no mixed consistencies) and NTL with advancement per treating SLP to thins via small sips with strict adherence to chin tuck strategy and/or consider offering thins separately from meal times. Pt would benefit from dry swallows after each  bite/sip to clear intermittent pharyngeal residue.   Consulted and Agree with Plan of Care  Patient       Patient will benefit from skilled therapeutic intervention in order to improve the following deficits and impairments:   Dysphagia, oropharyngeal phase    Recommendations/Treatment - 05/15/18 1733      Swallow Evaluation Recommendations   SLP Diet Recommendations  Dysphagia 3 (mechanical soft);Thin;Nectar    Liquid Administration via  [employer];No straw    Medication Administration  Whole meds with puree    Supervision  Patient able to self feed    Compensations  Minimize environmental distractions;Slow rate;Small sips/bites;Multiple dry swallows after each bite/sip;Effortful swallow;Chin tuck   chin tuck with thins if chosen   Postural Changes  Seated upright at 90 degrees;Remain upright for at least 30 minutes after feeds/meals  Prognosis - 05/15/18 1734      Prognosis   Prognosis for Safe Diet Advancement  Fair    Barriers to Reach Goals  Severity of deficits      Individuals Consulted   Consulted and Agree with Results and Recommendations  Patient    Report Sent to   Referring physician;Facility (Comment)   [facility]      Problem List Patient Active Problem List   Diagnosis Date Noted  . Abnormal thyroid blood test 05/26/2016  . Bilateral leg pain 12/18/2015  . Bilateral leg edema 11/18/2015  . Abnormality of gait 11/18/2015  . Paresthesia 11/18/2015   Thank you,  Genene Churn, CCC-SLP [phone]  PORTER,DABNEY 05/15/2018, 5:35 PM  [facility] [address] [place], [place], [address] Phone: [phone]   Fax:  [phone]  Name: YOHANCE HATHORNE MRN: [id number] Date of Birth: 03/18/1956

## 2018-05-16 DIAGNOSIS — L89624 Pressure ulcer of left heel, stage 4: Secondary | ICD-10-CM | POA: Diagnosis not present

## 2018-05-16 DIAGNOSIS — L89154 Pressure ulcer of sacral region, stage 4: Secondary | ICD-10-CM | POA: Diagnosis not present

## 2018-05-22 DIAGNOSIS — R52 Pain, unspecified: Secondary | ICD-10-CM | POA: Diagnosis not present

## 2018-05-22 DIAGNOSIS — I10 Essential (primary) hypertension: Secondary | ICD-10-CM | POA: Diagnosis not present

## 2018-05-22 DIAGNOSIS — G9341 Metabolic encephalopathy: Secondary | ICD-10-CM | POA: Diagnosis not present

## 2018-05-23 DIAGNOSIS — L89154 Pressure ulcer of sacral region, stage 4: Secondary | ICD-10-CM | POA: Diagnosis not present

## 2018-05-30 DIAGNOSIS — L89154 Pressure ulcer of sacral region, stage 4: Secondary | ICD-10-CM | POA: Diagnosis not present

## 2018-05-30 DIAGNOSIS — L98419 Non-pressure chronic ulcer of buttock with unspecified severity: Secondary | ICD-10-CM | POA: Diagnosis not present

## 2018-06-06 DIAGNOSIS — L98419 Non-pressure chronic ulcer of buttock with unspecified severity: Secondary | ICD-10-CM | POA: Diagnosis not present

## 2018-06-06 DIAGNOSIS — L89154 Pressure ulcer of sacral region, stage 4: Secondary | ICD-10-CM | POA: Diagnosis not present

## 2018-06-12 DIAGNOSIS — L89154 Pressure ulcer of sacral region, stage 4: Secondary | ICD-10-CM | POA: Diagnosis not present

## 2018-06-12 DIAGNOSIS — I10 Essential (primary) hypertension: Secondary | ICD-10-CM | POA: Diagnosis not present

## 2018-06-12 DIAGNOSIS — R52 Pain, unspecified: Secondary | ICD-10-CM | POA: Diagnosis not present

## 2018-06-12 DIAGNOSIS — G4700 Insomnia, unspecified: Secondary | ICD-10-CM | POA: Diagnosis not present

## 2018-06-13 DIAGNOSIS — L98419 Non-pressure chronic ulcer of buttock with unspecified severity: Secondary | ICD-10-CM | POA: Diagnosis not present

## 2018-06-13 DIAGNOSIS — L89154 Pressure ulcer of sacral region, stage 4: Secondary | ICD-10-CM | POA: Diagnosis not present

## 2018-06-21 DIAGNOSIS — L98419 Non-pressure chronic ulcer of buttock with unspecified severity: Secondary | ICD-10-CM | POA: Diagnosis not present

## 2018-06-21 DIAGNOSIS — L89154 Pressure ulcer of sacral region, stage 4: Secondary | ICD-10-CM | POA: Diagnosis not present

## 2018-06-27 DIAGNOSIS — L89154 Pressure ulcer of sacral region, stage 4: Secondary | ICD-10-CM | POA: Diagnosis not present

## 2018-06-27 DIAGNOSIS — L97829 Non-pressure chronic ulcer of other part of left lower leg with unspecified severity: Secondary | ICD-10-CM | POA: Diagnosis not present

## 2018-07-04 DIAGNOSIS — F1010 Alcohol abuse, uncomplicated: Secondary | ICD-10-CM | POA: Diagnosis not present

## 2018-07-04 DIAGNOSIS — I10 Essential (primary) hypertension: Secondary | ICD-10-CM | POA: Diagnosis not present

## 2018-07-04 DIAGNOSIS — R52 Pain, unspecified: Secondary | ICD-10-CM | POA: Diagnosis not present

## 2018-07-05 DIAGNOSIS — L89154 Pressure ulcer of sacral region, stage 4: Secondary | ICD-10-CM | POA: Diagnosis not present

## 2018-07-05 DIAGNOSIS — S81002D Unspecified open wound, left knee, subsequent encounter: Secondary | ICD-10-CM | POA: Diagnosis not present

## 2018-07-14 DIAGNOSIS — G4700 Insomnia, unspecified: Secondary | ICD-10-CM | POA: Diagnosis not present

## 2018-07-14 DIAGNOSIS — F4323 Adjustment disorder with mixed anxiety and depressed mood: Secondary | ICD-10-CM | POA: Diagnosis not present

## 2018-07-18 DIAGNOSIS — L89154 Pressure ulcer of sacral region, stage 4: Secondary | ICD-10-CM | POA: Diagnosis not present

## 2018-07-25 DIAGNOSIS — L97819 Non-pressure chronic ulcer of other part of right lower leg with unspecified severity: Secondary | ICD-10-CM | POA: Diagnosis not present

## 2018-08-01 DIAGNOSIS — L89154 Pressure ulcer of sacral region, stage 4: Secondary | ICD-10-CM | POA: Diagnosis not present

## 2018-08-02 DIAGNOSIS — L89154 Pressure ulcer of sacral region, stage 4: Secondary | ICD-10-CM | POA: Diagnosis not present

## 2018-08-02 DIAGNOSIS — S81002D Unspecified open wound, left knee, subsequent encounter: Secondary | ICD-10-CM | POA: Diagnosis not present

## 2018-08-21 DIAGNOSIS — R1313 Dysphagia, pharyngeal phase: Secondary | ICD-10-CM | POA: Diagnosis not present

## 2018-08-21 DIAGNOSIS — E46 Unspecified protein-calorie malnutrition: Secondary | ICD-10-CM | POA: Diagnosis not present

## 2018-08-21 DIAGNOSIS — I10 Essential (primary) hypertension: Secondary | ICD-10-CM | POA: Diagnosis not present

## 2018-08-21 DIAGNOSIS — T07XXXA Unspecified multiple injuries, initial encounter: Secondary | ICD-10-CM | POA: Diagnosis not present

## 2018-08-22 DIAGNOSIS — E039 Hypothyroidism, unspecified: Secondary | ICD-10-CM | POA: Diagnosis not present

## 2018-08-22 DIAGNOSIS — E559 Vitamin D deficiency, unspecified: Secondary | ICD-10-CM | POA: Diagnosis not present

## 2018-08-22 DIAGNOSIS — Z79899 Other long term (current) drug therapy: Secondary | ICD-10-CM | POA: Diagnosis not present

## 2018-08-22 DIAGNOSIS — L89154 Pressure ulcer of sacral region, stage 4: Secondary | ICD-10-CM | POA: Diagnosis not present

## 2018-08-22 DIAGNOSIS — D649 Anemia, unspecified: Secondary | ICD-10-CM | POA: Diagnosis not present

## 2018-08-23 DIAGNOSIS — D539 Nutritional anemia, unspecified: Secondary | ICD-10-CM | POA: Diagnosis not present

## 2018-08-23 DIAGNOSIS — E43 Unspecified severe protein-calorie malnutrition: Secondary | ICD-10-CM | POA: Diagnosis not present

## 2018-08-23 DIAGNOSIS — Z85118 Personal history of other malignant neoplasm of bronchus and lung: Secondary | ICD-10-CM | POA: Diagnosis not present

## 2018-08-29 DIAGNOSIS — L98499 Non-pressure chronic ulcer of skin of other sites with unspecified severity: Secondary | ICD-10-CM | POA: Diagnosis not present

## 2018-08-30 DIAGNOSIS — L89154 Pressure ulcer of sacral region, stage 4: Secondary | ICD-10-CM | POA: Diagnosis not present

## 2018-09-05 DIAGNOSIS — L98499 Non-pressure chronic ulcer of skin of other sites with unspecified severity: Secondary | ICD-10-CM | POA: Diagnosis not present

## 2018-09-05 DIAGNOSIS — L89154 Pressure ulcer of sacral region, stage 4: Secondary | ICD-10-CM | POA: Diagnosis not present

## 2018-10-07 ENCOUNTER — Other Ambulatory Visit: Payer: Self-pay

## 2018-10-07 ENCOUNTER — Encounter (HOSPITAL_COMMUNITY): Payer: Self-pay | Admitting: *Deleted

## 2018-10-07 ENCOUNTER — Inpatient Hospital Stay (HOSPITAL_COMMUNITY)
Admission: EM | Admit: 2018-10-07 | Discharge: 2018-10-14 | DRG: 947 | Disposition: E | Payer: Medicare Other | Source: Skilled Nursing Facility | Attending: Internal Medicine | Admitting: Internal Medicine

## 2018-10-07 ENCOUNTER — Emergency Department (HOSPITAL_COMMUNITY): Payer: Medicare Other

## 2018-10-07 DIAGNOSIS — E43 Unspecified severe protein-calorie malnutrition: Secondary | ICD-10-CM | POA: Diagnosis present

## 2018-10-07 DIAGNOSIS — L89154 Pressure ulcer of sacral region, stage 4: Secondary | ICD-10-CM | POA: Diagnosis present

## 2018-10-07 DIAGNOSIS — K219 Gastro-esophageal reflux disease without esophagitis: Secondary | ICD-10-CM | POA: Diagnosis present

## 2018-10-07 DIAGNOSIS — Z681 Body mass index (BMI) 19 or less, adult: Secondary | ICD-10-CM | POA: Diagnosis not present

## 2018-10-07 DIAGNOSIS — G629 Polyneuropathy, unspecified: Secondary | ICD-10-CM | POA: Diagnosis present

## 2018-10-07 DIAGNOSIS — C3491 Malignant neoplasm of unspecified part of right bronchus or lung: Secondary | ICD-10-CM | POA: Diagnosis present

## 2018-10-07 DIAGNOSIS — E871 Hypo-osmolality and hyponatremia: Secondary | ICD-10-CM | POA: Diagnosis present

## 2018-10-07 DIAGNOSIS — Z515 Encounter for palliative care: Secondary | ICD-10-CM | POA: Diagnosis present

## 2018-10-07 DIAGNOSIS — J9601 Acute respiratory failure with hypoxia: Secondary | ICD-10-CM | POA: Diagnosis not present

## 2018-10-07 DIAGNOSIS — F419 Anxiety disorder, unspecified: Secondary | ICD-10-CM | POA: Diagnosis present

## 2018-10-07 DIAGNOSIS — Z7189 Other specified counseling: Secondary | ICD-10-CM | POA: Diagnosis not present

## 2018-10-07 DIAGNOSIS — G8929 Other chronic pain: Secondary | ICD-10-CM | POA: Diagnosis present

## 2018-10-07 DIAGNOSIS — Z885 Allergy status to narcotic agent status: Secondary | ICD-10-CM

## 2018-10-07 DIAGNOSIS — F17210 Nicotine dependence, cigarettes, uncomplicated: Secondary | ICD-10-CM | POA: Diagnosis present

## 2018-10-07 DIAGNOSIS — Z902 Acquired absence of lung [part of]: Secondary | ICD-10-CM

## 2018-10-07 DIAGNOSIS — Z825 Family history of asthma and other chronic lower respiratory diseases: Secondary | ICD-10-CM

## 2018-10-07 DIAGNOSIS — Z7401 Bed confinement status: Secondary | ICD-10-CM | POA: Diagnosis not present

## 2018-10-07 DIAGNOSIS — G609 Hereditary and idiopathic neuropathy, unspecified: Secondary | ICD-10-CM

## 2018-10-07 DIAGNOSIS — I4581 Long QT syndrome: Secondary | ICD-10-CM | POA: Diagnosis present

## 2018-10-07 DIAGNOSIS — M549 Dorsalgia, unspecified: Secondary | ICD-10-CM

## 2018-10-07 DIAGNOSIS — G893 Neoplasm related pain (acute) (chronic): Principal | ICD-10-CM | POA: Diagnosis present

## 2018-10-07 DIAGNOSIS — R1310 Dysphagia, unspecified: Secondary | ICD-10-CM

## 2018-10-07 DIAGNOSIS — Z8249 Family history of ischemic heart disease and other diseases of the circulatory system: Secondary | ICD-10-CM

## 2018-10-07 DIAGNOSIS — R64 Cachexia: Secondary | ICD-10-CM | POA: Diagnosis present

## 2018-10-07 DIAGNOSIS — R918 Other nonspecific abnormal finding of lung field: Secondary | ICD-10-CM | POA: Diagnosis present

## 2018-10-07 DIAGNOSIS — I10 Essential (primary) hypertension: Secondary | ICD-10-CM | POA: Diagnosis present

## 2018-10-07 DIAGNOSIS — Z818 Family history of other mental and behavioral disorders: Secondary | ICD-10-CM

## 2018-10-07 DIAGNOSIS — R0600 Dyspnea, unspecified: Secondary | ICD-10-CM | POA: Diagnosis not present

## 2018-10-07 DIAGNOSIS — J690 Pneumonitis due to inhalation of food and vomit: Secondary | ICD-10-CM | POA: Diagnosis present

## 2018-10-07 DIAGNOSIS — L89153 Pressure ulcer of sacral region, stage 3: Secondary | ICD-10-CM

## 2018-10-07 DIAGNOSIS — M069 Rheumatoid arthritis, unspecified: Secondary | ICD-10-CM | POA: Diagnosis present

## 2018-10-07 DIAGNOSIS — K117 Disturbances of salivary secretion: Secondary | ICD-10-CM | POA: Diagnosis not present

## 2018-10-07 DIAGNOSIS — R52 Pain, unspecified: Secondary | ICD-10-CM | POA: Diagnosis not present

## 2018-10-07 DIAGNOSIS — Z820 Family history of epilepsy and other diseases of the nervous system: Secondary | ICD-10-CM

## 2018-10-07 DIAGNOSIS — Z66 Do not resuscitate: Secondary | ICD-10-CM | POA: Diagnosis present

## 2018-10-07 DIAGNOSIS — R627 Adult failure to thrive: Secondary | ICD-10-CM | POA: Diagnosis present

## 2018-10-07 DIAGNOSIS — I959 Hypotension, unspecified: Secondary | ICD-10-CM | POA: Diagnosis present

## 2018-10-07 DIAGNOSIS — I471 Supraventricular tachycardia: Secondary | ICD-10-CM

## 2018-10-07 DIAGNOSIS — Z79891 Long term (current) use of opiate analgesic: Secondary | ICD-10-CM

## 2018-10-07 DIAGNOSIS — C3490 Malignant neoplasm of unspecified part of unspecified bronchus or lung: Secondary | ICD-10-CM | POA: Insufficient documentation

## 2018-10-07 DIAGNOSIS — Z85118 Personal history of other malignant neoplasm of bronchus and lung: Secondary | ICD-10-CM | POA: Diagnosis not present

## 2018-10-07 DIAGNOSIS — Z803 Family history of malignant neoplasm of breast: Secondary | ICD-10-CM

## 2018-10-07 DIAGNOSIS — Z79899 Other long term (current) drug therapy: Secondary | ICD-10-CM

## 2018-10-07 LAB — COMPREHENSIVE METABOLIC PANEL
ALT: 11 U/L (ref 0–44)
ANION GAP: 8 (ref 5–15)
AST: 13 U/L — ABNORMAL LOW (ref 15–41)
Albumin: 2.1 g/dL — ABNORMAL LOW (ref 3.5–5.0)
Alkaline Phosphatase: 77 U/L (ref 38–126)
BUN: 9 mg/dL (ref 8–23)
CHLORIDE: 97 mmol/L — AB (ref 98–111)
CO2: 25 mmol/L (ref 22–32)
Calcium: 8 mg/dL — ABNORMAL LOW (ref 8.9–10.3)
Creatinine, Ser: <0.3 mg/dL — ABNORMAL LOW (ref 0.61–1.24)
Glucose, Bld: 116 mg/dL — ABNORMAL HIGH (ref 70–99)
Potassium: 3.5 mmol/L (ref 3.5–5.1)
SODIUM: 130 mmol/L — AB (ref 135–145)
Total Bilirubin: 0.4 mg/dL (ref 0.3–1.2)
Total Protein: 6.5 g/dL (ref 6.5–8.1)

## 2018-10-07 LAB — CBC WITH DIFFERENTIAL/PLATELET
Abs Immature Granulocytes: 0.04 10*3/uL (ref 0.00–0.07)
Basophils Absolute: 0 10*3/uL (ref 0.0–0.1)
Basophils Relative: 0 %
EOS PCT: 0 %
Eosinophils Absolute: 0 10*3/uL (ref 0.0–0.5)
HEMATOCRIT: 33.8 % — AB (ref 39.0–52.0)
Hemoglobin: 10.3 g/dL — ABNORMAL LOW (ref 13.0–17.0)
Immature Granulocytes: 0 %
LYMPHS ABS: 0.3 10*3/uL — AB (ref 0.7–4.0)
LYMPHS PCT: 2 %
MCH: 26.2 pg (ref 26.0–34.0)
MCHC: 30.5 g/dL (ref 30.0–36.0)
MCV: 86 fL (ref 80.0–100.0)
MONO ABS: 0.5 10*3/uL (ref 0.1–1.0)
MONOS PCT: 3 %
NEUTROS PCT: 95 %
NRBC: 0 % (ref 0.0–0.2)
Neutro Abs: 12.7 10*3/uL — ABNORMAL HIGH (ref 1.7–7.7)
Platelets: 386 10*3/uL (ref 150–400)
RBC: 3.93 MIL/uL — AB (ref 4.22–5.81)
RDW: 15 % (ref 11.5–15.5)
WBC: 13.5 10*3/uL — AB (ref 4.0–10.5)

## 2018-10-07 LAB — MRSA PCR SCREENING: MRSA by PCR: NEGATIVE

## 2018-10-07 MED ORDER — GLYCOPYRROLATE 1 MG PO TABS
1.0000 mg | ORAL_TABLET | ORAL | Status: DC | PRN
  Administered 2018-10-08 – 2018-10-10 (×2): 1 mg via ORAL
  Filled 2018-10-07 (×2): qty 1

## 2018-10-07 MED ORDER — ACETAMINOPHEN 650 MG RE SUPP: 650.0000 mg | Freq: Four times a day (QID) | RECTAL | Status: DC | PRN

## 2018-10-07 MED ORDER — ONDANSETRON HCL 4 MG/2ML IJ SOLN: 4.0000 mg | Freq: Four times a day (QID) | INTRAMUSCULAR | Status: DC | PRN

## 2018-10-07 MED ORDER — GLYCOPYRROLATE 0.2 MG/ML IJ SOLN
0.2000 mg | INTRAMUSCULAR | Status: DC | PRN
  Administered 2018-10-07 – 2018-10-10 (×3): 0.2 mg via INTRAVENOUS
  Filled 2018-10-07 (×3): qty 1

## 2018-10-07 MED ORDER — SODIUM CHLORIDE 0.9 % IV BOLUS
1000.0000 mL | Freq: Once | INTRAVENOUS | Status: AC
  Administered 2018-10-07: 1000 mL via INTRAVENOUS

## 2018-10-07 MED ORDER — BIOTENE DRY MOUTH MT LIQD: 15.0000 mL | OROMUCOSAL | Status: DC | PRN

## 2018-10-07 MED ORDER — HYDROMORPHONE HCL 1 MG/ML IJ SOLN
1.0000 mg | Freq: Once | INTRAMUSCULAR | Status: AC
  Administered 2018-10-07: 1 mg via INTRAVENOUS
  Filled 2018-10-07: qty 1

## 2018-10-07 MED ORDER — GLYCOPYRROLATE 0.2 MG/ML IJ SOLN: 0.2000 mg | INTRAMUSCULAR | Status: DC | PRN

## 2018-10-07 MED ORDER — LORAZEPAM 2 MG/ML IJ SOLN
1.0000 mg | INTRAMUSCULAR | Status: DC | PRN
  Administered 2018-10-07: 1 mg via INTRAVENOUS
  Filled 2018-10-07 (×2): qty 1

## 2018-10-07 MED ORDER — HALOPERIDOL 0.5 MG PO TABS: 0.5000 mg | ORAL_TABLET | ORAL | Status: DC | PRN

## 2018-10-07 MED ORDER — HYDROMORPHONE HCL 1 MG/ML IJ SOLN
1.0000 mg | Freq: Once | INTRAMUSCULAR | Status: AC
Start: 2018-10-07 — End: 2018-10-07
  Administered 2018-10-07: 1 mg via INTRAVENOUS
  Filled 2018-10-07: qty 1

## 2018-10-07 MED ORDER — HALOPERIDOL LACTATE 5 MG/ML IJ SOLN: 0.5000 mg | INTRAMUSCULAR | Status: DC | PRN

## 2018-10-07 MED ORDER — IPRATROPIUM-ALBUTEROL 0.5-2.5 (3) MG/3ML IN SOLN
3.0000 mL | RESPIRATORY_TRACT | Status: DC | PRN
  Administered 2018-10-07 – 2018-10-08 (×2): 3 mL via RESPIRATORY_TRACT
  Filled 2018-10-07 (×2): qty 3

## 2018-10-07 MED ORDER — LORAZEPAM 1 MG PO TABS: 1.0000 mg | ORAL_TABLET | ORAL | Status: DC | PRN

## 2018-10-07 MED ORDER — SODIUM CHLORIDE 0.9 % IV SOLN: INTRAVENOUS | Status: DC

## 2018-10-07 MED ORDER — ACETAMINOPHEN 325 MG PO TABS: 650.0000 mg | ORAL_TABLET | Freq: Four times a day (QID) | ORAL | Status: DC | PRN

## 2018-10-07 MED ORDER — IPRATROPIUM-ALBUTEROL 0.5-2.5 (3) MG/3ML IN SOLN
3.0000 mL | Freq: Once | RESPIRATORY_TRACT | Status: AC
Start: 2018-10-07 — End: 2018-10-07
  Administered 2018-10-07: 3 mL via RESPIRATORY_TRACT
  Filled 2018-10-07: qty 3

## 2018-10-07 MED ORDER — HYDROMORPHONE HCL 1 MG/ML IJ SOLN
1.0000 mg | INTRAMUSCULAR | Status: DC | PRN
  Administered 2018-10-07: 2 mg via INTRAVENOUS
  Administered 2018-10-07: 1 mg via INTRAVENOUS
  Administered 2018-10-07 – 2018-10-09 (×11): 2 mg via INTRAVENOUS
  Filled 2018-10-07 (×11): qty 2
  Filled 2018-10-07: qty 1
  Filled 2018-10-07: qty 2

## 2018-10-07 MED ORDER — LORAZEPAM 2 MG/ML PO CONC
1.0000 mg | ORAL | Status: DC | PRN
  Filled 2018-10-07: qty 0.5

## 2018-10-07 MED ORDER — IPRATROPIUM-ALBUTEROL 0.5-2.5 (3) MG/3ML IN SOLN
3.0000 mL | Freq: Three times a day (TID) | RESPIRATORY_TRACT | Status: DC
  Administered 2018-10-08 – 2018-10-09 (×6): 3 mL via RESPIRATORY_TRACT
  Filled 2018-10-07 (×5): qty 3

## 2018-10-07 MED ORDER — HALOPERIDOL LACTATE 2 MG/ML PO CONC
0.5000 mg | ORAL | Status: DC | PRN
  Filled 2018-10-07: qty 0.3

## 2018-10-07 MED ORDER — POLYVINYL ALCOHOL 1.4 % OP SOLN: 1.0000 [drp] | Freq: Four times a day (QID) | OPHTHALMIC | Status: DC | PRN

## 2018-10-07 MED ORDER — HYDROMORPHONE HCL 2 MG/ML IJ SOLN
INTRAMUSCULAR | Status: AC
  Administered 2018-10-07: 2 mg via INTRAVENOUS
  Filled 2018-10-07: qty 1

## 2018-10-07 MED ORDER — ONDANSETRON HCL 4 MG/2ML IJ SOLN
4.0000 mg | Freq: Once | INTRAMUSCULAR | Status: AC
  Administered 2018-10-07: 4 mg via INTRAVENOUS
  Filled 2018-10-07: qty 2

## 2018-10-07 MED ORDER — ONDANSETRON 4 MG PO TBDP: 4.0000 mg | ORAL_TABLET | Freq: Four times a day (QID) | ORAL | Status: DC | PRN

## 2018-10-07 NOTE — ED Provider Notes (Addendum)
[facility] EMERGENCY DEPARTMENT Provider Note   CSN: [id number] Arrival date & time: 10/06/2018  0410    History   Chief Complaint Chief Complaint  Patient presents with  . Tachycardia    HPI Dillon Carter is a 63 y.o. male.   The history is provided by the patient and the nursing home.  He has history of hypertension, lung cancer, rheumatoid arthritis, chronic back pain and is in hospice because of his lung cancer.  He was transferred here from a skilled nursing facility because of rapid heart rate.  Patient is complaining of pain in his buttock area where he has a known stage III wound.  Per nursing home report, he refuses to get turned unless he gets his pain medication and has not had any pain medication today.  He is supposed to be on OxyContin and oxycodone-acetaminophen.  Past Medical History:  Diagnosis Date  . Anxiety   . Chronic back pain   . GERD (gastroesophageal reflux disease)   . Hepatitis C   . Hypertension   . Lung cancer ([place])   . Migraine   . Osteoarthritis   . Peripheral neuropathy   . Rheumatoid arthritis [facility])     Patient Active Problem List   Diagnosis Date Noted  . Abnormal thyroid blood test 05/26/2016  . Bilateral leg pain 12/18/2015  . Bilateral leg edema 11/18/2015  . Abnormality of gait 11/18/2015  . Paresthesia 11/18/2015    Past Surgical History:  Procedure Laterality Date  . BACK SURGERY    . BRAIN SURGERY     Removed blood clot  . KNEE SURGERY    . LUNG SURGERY    . ROTATOR CUFF REPAIR          Home Medications    Prior to Admission medications   Medication Sig Start Date End Date Taking? Authorizing Provider  ALPRAZolam Duanne Moron) 1 MG tablet Take 1 mg by mouth.    [provider]  amLODipine (NORVASC) 10 MG tablet Take 10 mg by mouth.    [provider]  enalapril (VASOTEC) 10 MG tablet Take 10 mg by mouth.    [provider]  hydrochlorothiazide (HYDRODIURIL) 25 MG tablet Take 25 mg by mouth.     [provider]  HYDROcodone-acetaminophen (NORCO) 10-325 MG tablet Take by mouth.    [provider]  Omeprazole 20 MG TBEC Take by mouth.    [provider]    Family History Family History  Problem Relation Age of Onset  . COPD Father   . Congestive Heart Failure Father   . Aneurysm Father   . Hypertension Mother   . Neuropathy Mother   . Dementia Mother   . Breast cancer Mother     Social History Social History   Tobacco Use  . Smoking status: Current Every Day Smoker    Packs/day: 1.00    Types: Cigarettes  Substance Use Topics  . Alcohol use: Yes    Alcohol/week: 0.0 standard drinks    Comment: Drinks at least 12 pack of beer per week - sometimes more.  . Drug use: No     Allergies   Codeine   Review of Systems Review of Systems  All other systems reviewed and are negative.    Physical Exam Updated Vital Signs BP 116/68   Pulse 83   Temp 98.4 F (36.9 C) (Oral)   Resp (!) 21   Ht 6' (1.829 m)   Wt 61.2 kg   SpO2  98%   BMI 18.31 kg/m   Physical Exam Vitals signs and nursing note reviewed.    Cachectic appearing 63 year old male, resting comfortably and in no acute distress. Vital signs are significant for rapid heart rate and borderline elevated respiratory rate. Oxygen saturation is 98%, which is normal. Head is normocephalic and atraumatic. PERRLA, EOMI. Oropharynx is clear. Neck is nontender and supple without adenopathy or JVD. Back is nontender and there is no CVA tenderness. Lungs have coarse expiratory rhonchi without rales or wheezes. Chest is nontender. Heart has regular rate and rhythm without murmur. Abdomen is soft, scaphoid, nontender without masses or hepatosplenomegaly and peristalsis is normoactive. Extremities have no cyanosis or edema, full range of motion is present. Skin is warm and dry without rash. Neurologic: Mental status is normal, cranial nerves are intact, there are no motor or sensory  deficits.  ED Treatments / Results  Labs (all labs ordered are listed, but only abnormal results are displayed) Labs Reviewed  COMPREHENSIVE METABOLIC PANEL - Abnormal; Notable for the following components:      Result Value   Sodium 130 (*)    Chloride 97 (*)    Glucose, Bld 116 (*)    Creatinine, Ser <0.30 (*)    Calcium 8.0 (*)    Albumin 2.1 (*)    AST 13 (*)    All other components within normal limits  CBC WITH DIFFERENTIAL/PLATELET - Abnormal; Notable for the following components:   WBC 13.5 (*)    RBC 3.93 (*)    Hemoglobin 10.3 (*)    HCT 33.8 (*)    Neutro Abs 12.7 (*)    Lymphs Abs 0.3 (*)    All other components within normal limits    EKG EKG Interpretation  Date/Time:  Sunday October 07 2018 04:18:56 EST Ventricular Rate:  140 PR Interval:    QRS Duration: 72 QT Interval:  330 QTC Calculation: 489 R Axis:   67 Text Interpretation:  Multifocal atrial tachycardia Low voltage, extremity leads Nonspecific T abnormalities, anterior leads Borderline prolonged QT interval When compared with ECG of 05/14/2010, QT has lengthened HEART RATE has increased Nonspecific T wave abnormality is now present Confirmed by Delora Fuel ([id number]) on 10/11/2018 4:24:39 AM   Radiology Dg Chest Port 1 View  Result Date: 10/11/2018 CLINICAL DATA:  63 year old male with tachycardia. EXAM: PORTABLE CHEST 1 VIEW COMPARISON:  Chest radiograph dated 07/29/2009 FINDINGS: There is complete opacification of the left hemithorax which may represent combination of atelectasis/infiltrate and pleural effusion. A mass is not excluded further evaluation with chest CT is recommended. The right lung is clear. There is slight shift of the mediastinum into the left hemithorax. No pneumothorax. No acute osseous pathology. Degenerative changes of the spine and lower cervical fixation hardware. IMPRESSION: Complete opacification of the left hemithorax, likely combination of atelectasis/infiltrate and pleural  effusion. A lung mass is not excluded. Further evaluation with chest CT recommended. Electronically Signed   By: Anner Crete M.D.   On: 09/18/2018 05:50    Procedures Procedures   Medications Ordered in ED Medications  ipratropium-albuterol (DUONEB) 0.5-2.5 (3) MG/3ML nebulizer solution 3 mL (has no administration in time range)  sodium chloride 0.9 % bolus 1,000 mL (has no administration in time range)  HYDROmorphone (DILAUDID) injection 1 mg (has no administration in time range)  ondansetron (ZOFRAN) injection 4 mg (has no administration in time range)     Initial Impression / Assessment and Plan / ED Course  I have reviewed the  triage vital signs and the nursing notes.  Pertinent labs & imaging results that were available during my care of the patient were reviewed by me and considered in my medical decision making (see chart for details).  Tachycardia secondary to pain and possible dehydration.  ECG shows multifocal atrial tachycardia.  We will give IV fluids and hydromorphone for pain.  Since he is hospice patient, priority should be obtaining adequate pain relief.  Old records are reviewed, and he had been admitted to [facility] last July with sepsis, unable to find documentation about CODE STATUS at that point.  DNR paperwork is with him today.  He has required several doses of hydromorphone to get modest control of pain.  Chest x-ray shows white out of the left lung.  After IV fluid, he stays persistently tachycardic.  I suspect that his lung white out is secondary to progression of his cancer.  He continues to keep adequate oxygen saturation.  He states that in spite of his hospice status, he would like to be admitted to the hospital.  Clinically, he does not have pneumonia.  He is afebrile and maintaining adequate oxygen saturation.  Will check screening labs and arrange for hospital admission.  Case is signed out to Dr. Thurnell Garbe to discuss with hospitalist  once screening labs are back.  Labs show hyponatremia, normocytic normochromic anemia which is new compared with 2011. .  Final Clinical Impressions(s) / ED Diagnoses   Final diagnoses:  Multifocal atrial tachycardia ([place])  Malignant neoplasm of right lung, unspecified part of lung ([place])  Pressure injury of sacral region, stage 3 [facility])  Hospice care patient  DNR (do not resuscitate)  Intractable pain    ED Discharge Orders    None       Delora Fuel, MD 05/69/79 [id number]    Delora Fuel, MD 65/53/74 [phone]

## 2018-10-07 NOTE — ED Triage Notes (Signed)
Pt arrived to er from [facility] home with c/o tachycardia and hypotension, upon arrival to er pt c/o pain to buttock area, per ems pt has wound to buttock region,

## 2018-10-07 NOTE — ED Notes (Signed)
ED Provider at bedside. 

## 2018-10-07 NOTE — ED Notes (Signed)
Pt reports that his pain has not changed, Dr Roxanne Mins notified, additional orders given,

## 2018-10-07 NOTE — Progress Notes (Signed)
Patient only took a few minutes of neb complained it was cold wanted water

## 2018-10-07 NOTE — ED Notes (Signed)
Per nursing home report pt has stage 3 wound to buttock and hip region, pt refuses to be turned until he is able to get pain medication, pt is lung cancer pt on hospice and last dose of pain medication was yesterday, comfort measures provided

## 2018-10-07 NOTE — H&P (Addendum)
History and Physical  DAIMIAN SUDBERRY ZCH:[id number] DOB: October 03, 1955 DOA: 09/22/2018  Referring physician: Roxanne Mins MD  PCP: Redmond School, MD   Chief Complaint: sent from SNF uncontrolled pain  HPI: Dillon Carter is a 63 y.o. male sent to ED from [facility] with tachycardia and uncontrolled pain.  He was there for hospice care for terminal lung cancer.  He is also been suffering with stage IV sacral decubitus ulcer with also associated chronic pain.  He was sent to the emergency department due to tachycardia, uncontrolled pain and hypotension.  The patient has not been eating and drinking well.  He is severely emaciated.  When he arrived in the ED he wheezing and coughing and given neb treatments.  His main complaint with uncontrolled pain and he received 4 separate doses of 1 mg Dilaudid IV injections.  Because the patient's pain was so difficult to get under control requiring multiple doses of IV Dilaudid with only minimal relief hospitalization was requested for further management.  Review of Systems: All systems reviewed and apart from history of presenting illness, are negative.  Past Medical History:  Diagnosis Date  . Anxiety   . Chronic back pain   . GERD (gastroesophageal reflux disease)   . Hepatitis C   . Hypertension   . Lung cancer ([place])   . Migraine   . Osteoarthritis   . Peripheral neuropathy   . Rheumatoid arthritis [facility])    Past Surgical History:  Procedure Laterality Date  . BACK SURGERY    . BRAIN SURGERY     Removed blood clot  . KNEE SURGERY    . LUNG SURGERY    . ROTATOR CUFF REPAIR     Social History:  reports that he has been smoking cigarettes. He has been smoking about 1.00 pack per day. He does not have any smokeless tobacco history on file. He reports current alcohol use. He reports that he does not use drugs.  Allergies  Allergen Reactions  . Codeine Rash    Family History  Problem Relation Age of Onset  . COPD Father   . Congestive Heart  Failure Father   . Aneurysm Father   . Hypertension Mother   . Neuropathy Mother   . Dementia Mother   . Breast cancer Mother     Prior to Admission medications   Medication Sig Start Date End Date Taking? Authorizing Provider  ALPRAZolam Duanne Moron) 1 MG tablet Take 1 mg by mouth.    [provider]  amLODipine (NORVASC) 10 MG tablet Take 10 mg by mouth.    [provider]  enalapril (VASOTEC) 10 MG tablet Take 10 mg by mouth.    [provider]  hydrochlorothiazide (HYDRODIURIL) 25 MG tablet Take 25 mg by mouth.    [provider]  HYDROcodone-acetaminophen (NORCO) 10-325 MG tablet Take by mouth.    [provider]  Omeprazole 20 MG TBEC Take by mouth.    [provider]   Physical Exam: Vitals:   09/15/2018 0530 10/08/2018 0600 09/16/2018 0630 09/22/2018 0700  BP: 100/65 104/63 98/62 116/80  Pulse: (!) 130 85 86 (!) 122  Resp: (!) 21 18 19 18   Temp:      TempSrc:      SpO2: 100% 94% 95% 95%  Weight:      Height:         General exam: Emaciated chronically ill-appearing male, vocalizing in no distress he is oriented x3 and cooperative.  He complains of pain  in the sacral wound area.  Head, eyes and ENT: Thick gray beard.  Nontraumatic and normocephalic. Pupils equally reacting to light and accommodation. Oral mucosa dry.  Neck: Supple. No JVD, carotid bruit or thyromegaly.  Lymphatics: No lymphadenopathy.  Respiratory system: Diffuse Rales and crackles heard bilaterally with diminished breath sounds on the right side. No increased work of breathing.  Cardiovascular system: S1 and S2 heard, tachycardic. No JVD, murmurs, gallops, clicks or pedal edema.  Gastrointestinal system: Abdomen is nondistended, soft and nontender. Normal bowel sounds heard. No organomegaly or masses appreciated.  Stage IV sacral decubitus ulcer.  Central nervous system: Alert and oriented. No focal neurological deficits.  Extremities: Symmetric 5 x 5  power. Peripheral pulses symmetrically felt.   Skin: No rashes or acute findings.  Musculoskeletal system: Negative exam.  Psychiatry: Pleasant and cooperative.  Labs on Admission:  Basic Metabolic Panel: Recent Labs  Lab 09/29/2018 0714  NA 130*  K 3.5  CL 97*  CO2 25  GLUCOSE 116*  BUN 9  CREATININE <0.30*  CALCIUM 8.0*   Liver Function Tests: Recent Labs  Lab 09/20/2018 0714  AST 13*  ALT 11  ALKPHOS 77  BILITOT 0.4  PROT 6.5  ALBUMIN 2.1*   No results for input(s): LIPASE, AMYLASE in the last 168 hours. No results for input(s): AMMONIA in the last 168 hours. CBC: Recent Labs  Lab 09/21/2018 0714  WBC 13.5*  NEUTROABS 12.7*  HGB 10.3*  HCT 33.8*  MCV 86.0  PLT 386   Cardiac Enzymes: No results for input(s): CKTOTAL, CKMB, CKMBINDEX, TROPONINI in the last 168 hours.  BNP (last 3 results) No results for input(s): PROBNP in the last 8760 hours. CBG: No results for input(s): GLUCAP in the last 168 hours.  Radiological Exams on Admission: Dg Chest Port 1 View  Result Date: 09/26/2018 CLINICAL DATA:  63 year old male with tachycardia. EXAM: PORTABLE CHEST 1 VIEW COMPARISON:  Chest radiograph dated 07/29/2009 FINDINGS: There is complete opacification of the left hemithorax which may represent combination of atelectasis/infiltrate and pleural effusion. A mass is not excluded further evaluation with chest CT is recommended. The right lung is clear. There is slight shift of the mediastinum into the left hemithorax. No pneumothorax. No acute osseous pathology. Degenerative changes of the spine and lower cervical fixation hardware. IMPRESSION: Complete opacification of the left hemithorax, likely combination of atelectasis/infiltrate and pleural effusion. A lung mass is not excluded. Further evaluation with chest CT recommended. Electronically Signed   By: Anner Crete M.D.   On: 09/30/2018 05:50   Assessment/Plan Principal Problem:   Uncontrolled pain Active  Problems:   Lung cancer (HCC)   GERD (gastroesophageal reflux disease)   Peripheral neuropathy   Chronic back pain   Hospice care patient   Sacral decubitus ulcer, stage IV ([place])   DNR (do not resuscitate)   1. Uncontrolled pain secondary to terminal lung cancer and stage IV sacral decubitus ulcer.  The patient appears to be at end-of-life.  We will start him on IV Dilaudid every 2 hours as needed.  If this does not control pain begin morphine bolus and continuous IV infusion.  End-of-life hospice care order set started.  I have requested a palliative medicine consult for symptom management. 2. Terminal lung cancer-pain management per hospice protocol. 3. Stage IV sacral decubitus ulcer - air mattress requested, turn for comfort.   4. Sinus tachycardia-likely secondary to dehydration and terminal cancer.  No telemetry monitoring. 5. Chronic pain- management addressed above. 6. DNR-we will  continue in hospital.  DVT Prophylaxis: Hospice patient Code Status: DNR Family Communication: No family present Disposition Plan: Inpatient hospice  Time spent: 75 minutes  [place] Nishi Wynetta Emery, MD [facility] How to contact the [facility] Attending or Consulting provider [place] or covering provider during after hours [place], for this patient?  1. Check the care team in [facility] and look for a) attending/consulting TRH provider listed and b) the [facility] team listed 2. Log into [email or web address] and use [place]'s universal password to access. If you do not have the password, please contact the hospital operator. 3. Locate the [facility] provider you are looking for under [facility] and page to a number that you can be directly reached. 4. If you still have difficulty reaching the provider, please page the [facility] (Director on Call) for the Hospitalists listed on amion for assistance.

## 2018-10-08 DIAGNOSIS — Z515 Encounter for palliative care: Secondary | ICD-10-CM

## 2018-10-08 DIAGNOSIS — Z7189 Other specified counseling: Secondary | ICD-10-CM

## 2018-10-08 DIAGNOSIS — R627 Adult failure to thrive: Secondary | ICD-10-CM | POA: Diagnosis present

## 2018-10-08 DIAGNOSIS — Z85118 Personal history of other malignant neoplasm of bronchus and lung: Secondary | ICD-10-CM

## 2018-10-08 MED ORDER — GABAPENTIN 300 MG PO CAPS
300.0000 mg | ORAL_CAPSULE | Freq: Two times a day (BID) | ORAL | Status: DC
  Administered 2018-10-08 – 2018-10-09 (×4): 300 mg via ORAL
  Filled 2018-10-08 (×4): qty 1

## 2018-10-08 MED ORDER — GABAPENTIN 600 MG PO TABS
300.0000 mg | ORAL_TABLET | Freq: Two times a day (BID) | ORAL | Status: DC
  Filled 2018-10-08 (×5): qty 0.5

## 2018-10-08 MED ORDER — SENNA 8.6 MG PO TABS
1.0000 | ORAL_TABLET | Freq: Every day | ORAL | Status: DC
  Filled 2018-10-08: qty 1

## 2018-10-08 MED ORDER — MORPHINE SULFATE ER 30 MG PO TBCR
30.0000 mg | EXTENDED_RELEASE_TABLET | Freq: Three times a day (TID) | ORAL | Status: DC
  Administered 2018-10-08 – 2018-10-10 (×6): 30 mg via ORAL
  Filled 2018-10-08 (×6): qty 1

## 2018-10-08 MED ORDER — ALPRAZOLAM 0.5 MG PO TABS
0.5000 mg | ORAL_TABLET | Freq: Two times a day (BID) | ORAL | Status: DC | PRN
  Administered 2018-10-09 (×2): 0.5 mg via ORAL
  Filled 2018-10-08 (×2): qty 1

## 2018-10-08 NOTE — Progress Notes (Signed)
Brief initial visit offering spiritual support. Will continue to follow.

## 2018-10-08 NOTE — NC FL2 (Signed)
[place] LEVEL OF CARE SCREENING TOOL     IDENTIFICATION  Patient Name: Dillon Carter Birthdate: 12-30-1955 Sex: male Admission Date (Current Location): 09/29/2018  [facility] and [place] Number:  [employer] and Address:  [place] [address], [place]      Provider Number: [id number]  Attending Physician Name and Address:  Murlean Iba, MD  Relative Name and Phone Number:       Current Level of Care: Hospital Recommended Level of Care: [place] Prior Approval Number:    Date Approved/Denied:   PASRR Number:    Discharge Plan: SNF    Current Diagnoses: Patient Active Problem List   Diagnosis Date Noted  . Palliative care by specialist   . History of lung cancer   . Adult failure to thrive   . Uncontrolled pain 09/22/2018  . Lung cancer ([place]) 09/18/2018  . GERD (gastroesophageal reflux disease) 09/21/2018  . Peripheral neuropathy 10/12/2018  . Chronic back pain 10/02/2018  . Goals of care, counseling/discussion 09/30/2018  . Sacral decubitus ulcer, stage IV ([place]) 09/17/2018  . DNR (do not resuscitate) 10/03/2018  . Abnormal thyroid blood test 05/26/2016  . Bilateral leg pain 12/18/2015  . Bilateral leg edema 11/18/2015  . Abnormality of gait 11/18/2015  . Paresthesia 11/18/2015    Orientation RESPIRATION BLADDER Height & Weight     Self, Place  O2(3L) Incontinent Weight: 114 lb 6.7 oz (51.9 kg) Height:  6' (182.9 cm)  BEHAVIORAL SYMPTOMS/MOOD NEUROLOGICAL BOWEL NUTRITION STATUS      Incontinent (regular)  AMBULATORY STATUS COMMUNICATION OF NEEDS Skin   Extensive Assist Verbally PU Stage and Appropriate Care(sacrum)                       Personal Care Assistance Level of Assistance  Bathing, Feeding, Dressing Bathing Assistance: Maximum assistance Feeding assistance: Limited assistance Dressing Assistance: Maximum assistance     Functional Limitations Info  Sight,  Hearing, Speech Sight Info: Adequate Hearing Info: Adequate Speech Info: Adequate    SPECIAL CARE FACTORS FREQUENCY                       Contractures Contractures Info: Not present    Additional Factors Info  Code Status, Allergies, Psychotropic Code Status Info: DNR Allergies Info: Codeine Psychotropic Info: Xanax         Current Medications (10/08/2018):  This is the current hospital active medication list Current Facility-Administered Medications  Medication Dose Route Frequency Provider Last Rate Last Dose  . 0.9 %  sodium chloride infusion   Intravenous Continuous Wynetta Emery, Clanford L, MD 20 mL/hr at 10/08/18 1618    . acetaminophen (TYLENOL) tablet 650 mg  650 mg Oral Q6H PRN Johnson, Clanford L, MD       Or  . acetaminophen (TYLENOL) suppository 650 mg  650 mg Rectal Q6H PRN Johnson, Clanford L, MD      . ALPRAZolam Duanne Moron) tablet 0.5 mg  0.5 mg Oral BID PRN Basilio Cairo, NP      . antiseptic oral rinse (BIOTENE) solution 15 mL  15 mL Topical PRN Johnson, Clanford L, MD      . gabapentin (NEURONTIN) capsule 300 mg  300 mg Oral BID Johnson, Clanford L, MD   300 mg at 10/08/18 1318  . glycopyrrolate (ROBINUL) tablet 1 mg  1 mg Oral Q4H PRN Murlean Iba, MD  Or  . glycopyrrolate (ROBINUL) injection 0.2 mg  0.2 mg Subcutaneous Q4H PRN Johnson, Clanford L, MD       Or  . glycopyrrolate (ROBINUL) injection 0.2 mg  0.2 mg Intravenous Q4H PRN Johnson, Clanford L, MD   0.2 mg at 10/08/18 0239  . haloperidol (HALDOL) tablet 0.5 mg  0.5 mg Oral Q4H PRN Johnson, Clanford L, MD       Or  . haloperidol lactate (HALDOL) injection 0.5 mg  0.5 mg Intravenous Q4H PRN Johnson, Clanford L, MD      . HYDROmorphone (DILAUDID) injection 1-2 mg  1-2 mg Intravenous Q2H PRN Johnson, Clanford L, MD   2 mg at 10/08/18 1515  . ipratropium-albuterol (DUONEB) 0.5-2.5 (3) MG/3ML nebulizer solution 3 mL  3 mL Nebulization Q2H PRN [place] Emery, Clanford L, MD   3 mL at 10/08/18 0311  .  ipratropium-albuterol (DUONEB) 0.5-2.5 (3) MG/3ML nebulizer solution 3 mL  3 mL Nebulization TID Johnson, Clanford L, MD   3 mL at 10/08/18 1346  . morphine (MS CONTIN) 12 hr tablet 30 mg  30 mg Oral Q8H Ihor Dow M, NP   30 mg at 10/08/18 1318  . ondansetron (ZOFRAN-ODT) disintegrating tablet 4 mg  4 mg Oral Q6H PRN Johnson, Clanford L, MD       Or  . ondansetron (ZOFRAN) injection 4 mg  4 mg Intravenous Q6H PRN Johnson, Clanford L, MD      . polyvinyl alcohol (LIQUIFILM TEARS) 1.4 % ophthalmic solution 1 drop  1 drop Both Eyes QID PRN Johnson, Clanford L, MD      . senna (SENOKOT) tablet 8.6 mg  1 tablet Oral Daily Basilio Cairo, NP         Discharge Medications: Please see discharge summary for a list of discharge medications.  Relevant Imaging Results:  Relevant Lab Results:   Additional Information    Lousie Calico, Clydene Pugh, LCSW

## 2018-10-08 NOTE — Progress Notes (Signed)
PROGRESS NOTE  Dillon Carter  LNL:[id number]  DOB: 1956/06/11  DOA: 10/06/2018 PCP: Redmond School, MD  Brief Admission Hx: 63 y.o. male sent to ED receiving hospice services from [facility] SNF sent to ED with tachycardia and uncontrolled pain.  Goal is to get pain better controlled and get patient back to [facility] SNF with hospice services, unless residential hospice is more appropriate.   MDM/Assessment & Plan:   1. Sinus tachycardia - much improved with better pain management.   2. Chronic pain - appreciate assistance of palliative care team for symptom management.  3. Stage IV sacral decubitus ulcers - the primary source of pain - continue working on pain management. 4. Lung cancer -  He had been treated for this at [facility]. 5. DNR - comfort care only.    Disposition Plan: Pt hopeful to return to [facility] with hospice services when pain better controlled  Consultants:  Palliative medicine  Procedures:  n/a  Antimicrobials:  n/a  Subjective: Pt ate most of breakfast, pain has been terrible this morning and not receiving pain meds in timely fashion  Objective: Vitals:   10/08/18 0527 10/08/18 0758 10/08/18 1251 10/08/18 1347  BP: 117/82  114/75   Pulse: 97  (!) 115   Resp: 18  18   Temp: 97.7 F (36.5 C)  98 F (36.7 C)   TempSrc: Oral  Oral   SpO2:  94% 98% 100%  Weight:      Height:        Intake/Output Summary (Last 24 hours) at 10/08/2018 1538 Last data filed at 10/08/2018 1300 Gross per 24 hour  Intake 708.97 ml  Output -  Net 708.97 ml   Filed Weights   09/25/2018 0414 10/12/2018 1200  Weight: 61.2 kg 51.9 kg   REVIEW OF SYSTEMS  As per history otherwise all reviewed and reported negative  Exam:  General exam: emaciated chronically ill appearing male, moderate distress noted, grimacing in pain.  Respiratory system: diminished BS on right.  Diffuse crackles and rales.  No increased work of breathing. Cardiovascular system: S1 & S2 heard.  Tachycardic. No JVD, murmurs, gallops, clicks or pedal edema. Gastrointestinal system: Abdomen is nondistended, soft and nontender. Normal bowel sounds heard. Skin: stage 4 sacral decubitus wounds Central nervous system: Alert and oriented. No focal neurological deficits. Extremities: no CCE.   Data Reviewed: Basic Metabolic Panel: Recent Labs  Lab 09/28/2018 0714  NA 130*  K 3.5  CL 97*  CO2 25  GLUCOSE 116*  BUN 9  CREATININE <0.30*  CALCIUM 8.0*   Liver Function Tests: Recent Labs  Lab 09/19/2018 0714  AST 13*  ALT 11  ALKPHOS 77  BILITOT 0.4  PROT 6.5  ALBUMIN 2.1*   No results for input(s): LIPASE, AMYLASE in the last 168 hours. No results for input(s): AMMONIA in the last 168 hours. CBC: Recent Labs  Lab 09/23/2018 0714  WBC 13.5*  NEUTROABS 12.7*  HGB 10.3*  HCT 33.8*  MCV 86.0  PLT 386   Cardiac Enzymes: No results for input(s): CKTOTAL, CKMB, CKMBINDEX, TROPONINI in the last 168 hours. CBG (last 3)  No results for input(s): GLUCAP in the last 72 hours. Recent Results (from the past 240 hour(s))  MRSA PCR Screening     Status: None   Collection Time: 09/28/2018  3:30 PM  Result Value Ref Range Status   MRSA by PCR NEGATIVE NEGATIVE Final    Comment:        The GeneXpert MRSA Assay (  FDA approved for NASAL specimens only), is one component of a comprehensive MRSA colonization surveillance program. It is not intended to diagnose MRSA infection nor to guide or monitor treatment for MRSA infections. Performed at [facility], [address] [place], [place] [address]     Studies: Dg Chest [facility] 1 View  Result Date: 10/08/2018 CLINICAL DATA:  63 year old male with tachycardia. EXAM: PORTABLE CHEST 1 VIEW COMPARISON:  Chest radiograph dated 07/29/2009 FINDINGS: There is complete opacification of the left hemithorax which may represent combination of atelectasis/infiltrate and pleural effusion. A mass is not excluded further evaluation with chest CT is  recommended. The right lung is clear. There is slight shift of the mediastinum into the left hemithorax. No pneumothorax. No acute osseous pathology. Degenerative changes of the spine and lower cervical fixation hardware. IMPRESSION: Complete opacification of the left hemithorax, likely combination of atelectasis/infiltrate and pleural effusion. A lung mass is not excluded. Further evaluation with chest CT recommended. Electronically Signed   By: Anner Crete M.D.   On: 09/25/2018 05:50   Scheduled Meds: . gabapentin  300 mg Oral BID  . ipratropium-albuterol  3 mL Nebulization TID  . morphine  30 mg Oral Q8H  . senna  1 tablet Oral Daily   Continuous Infusions: . sodium chloride      Principal Problem:   Uncontrolled pain Active Problems:   Lung cancer (HCC)   GERD (gastroesophageal reflux disease)   Peripheral neuropathy   Chronic back pain   Goals of care, counseling/discussion   Sacral decubitus ulcer, stage IV ([place])   DNR (do not resuscitate)   Palliative care by specialist   History of lung cancer   Adult failure to thrive  Time spent:   Irwin Brakeman, MD [facility] 10/08/2018, 3:38 PM    LOS: 1 day  How to contact the [facility] Attending or Consulting provider [place] or covering provider during after hours [place], for this patient?  1. Check the care team in [facility] and look for a) attending/consulting TRH provider listed and b) the [facility] team listed 2. Log into [email or web address] and use [place]'s universal password to access. If you do not have the password, please contact the hospital operator. 3. Locate the [facility] provider you are looking for under [facility] and page to a number that you can be directly reached. 4. If you still have difficulty reaching the provider, please page the [facility] (Director on Call) for the Hospitalists listed on amion for assistance.

## 2018-10-08 NOTE — Clinical Social Work Note (Signed)
Patient is a from [place] where he is involved with hospice services. Per palliative care note it is the plan for patient to return to palliative with hospice services at discharge.    Tracina Beaumont, Clydene Pugh, LCSW

## 2018-10-08 NOTE — Consult Note (Addendum)
Consultation Note Date: 10/08/2018   Patient Name: Dillon Carter  DOB: [birth year]  MRN: [id number]  Age / Sex: 63 y.o., male  PCP: Redmond School, MD Referring Physician: Murlean Iba, MD  Reason for Consultation: Establishing goals of care and Pain control  HPI/Patient Profile: 63 y.o. male  with past medical history of lung cancer in remission, hepatitis C, HTN, migraines, neuropathy, rheumatoid arthritis, GERD, anxiety, chronic pain admitted on 09/20/2018 with tachycardia, hypotension and pain. Current hospice patient at SNF. In ED, patient's pain was difficult to manage requiring multiple IV Dilaudid doses therefore hospital admission for further symptom management. Palliative medicine consultation for pain management/GOC.   Clinical Assessment and Goals of Care:  I have reviewed medical records, discussed with Dr. Wynetta Emery, and met with patient at bedside.   Dillon Carter is awake, alert to person/place but intermittent confusion and poor historian. Dillon Carter tells me his mother is in the hospital. He gives permission to speak with his mother and brother Dillon Carter) about his care.   Dillon Carter complains of 10/10 pain that is generalized. He does not give a number when asked what level is pain is after IV dilaudid. He cannot explain his pre-admission symptom management medications with me. He tells me hospice services started yesterday.   Attempted to call patient's mother, Dillon Carter. Disconnected number.   Spoke with patient's brother Dillon Carter) and sister-in-law Dillon Carter) via telephone.  I introduced Palliative Medicine as specialized medical care for people living with serious illness. It focuses on providing relief from the symptoms and stress of a serious illness. The goal is to improve quality of life for both the patient and the family.  Dillon Carter and Dillon Carter provide an extensive health review of Dillon Carter. He was treated for  lung cancer ~6 years ago s/p lobectomy but per family, has been in remission. Dillon Carter and Dillon Carter share that in July 2019, Dillon Carter was found down at home and taken to [facility] to be treated. It is believed that he was on the floor for 7-10 days. Dillon Carter shares there was extensive testing done at this time with no concerns of worsening cancer.   After this admission, Dillon Carter was discharged to SNF. Dillon Carter has continued to decline functional and nutritionally. He has developed multiple, painful bed sores. He has lost 20lbs since January. At one point, he had a feeding tube but eventually pulled it out and refused to ever have it replaced. He has been refusing prostate to help with wound healing. He will eat 90% of meals if it's food he enjoys. He does accept ensure drinks. He refuses to work with therapy and is now bed bound. Dillon Carter expressed that he is depressed. He begs for pain medication.   Dillon Carter and Dillon Carter share that hospice services were initiated last Thursday, 10/04/18 to help manage his symptoms. She thinks this is through [employer]. They made medication adjustments but Dillon Carter and Dillon Roys do not think he was receiving breakthrough doses of mediation. They also explain that Dillon Carter not only has pain from wounds but also from  history of neuropathy and cervical spinal surgeries.   Dillon Carter and Dillon Carter explain that [name] and his mother, Dillon Carter, may have POA paperwork but unfortunately Dillon Carter has experienced multiple falls, intermittent confusion, and is now wheelchair bound and also residing at [facility]. Per Dillon Carter does understand that Dillon Carter recently started hospice services.   I attempted to elicit values and goals of care important to the patient and family. Dillon Carter and Dillon Carter emphasize importance of managing Dillon Carter's comfort and pain. We discussed poor long-term prognosis with overall failure to thrive and deconditioning. Dillon Carter and Dillon Carter hope that if his pain is better managed, he would be more willing to comply with nursing care  (turning/changing). They do not think he will walk again.    Discussed EOL expectations and residential hospice eligibility if he seems to be transitioning to EOL. Discussed adjustments in symptom management medications and watchful waiting to see if his pain is better controlled with extended release morphine. Discussed comfort feeds.   Would be ideal to try and get him back to [facility] with hospice so he can be close to his mother.   Questions and concerns were addressed. Reassured Dillon Carter and Dillon Carter of continued support from PMT this admission.    SUMMARY OF RECOMMENDATIONS    Current hospice patient enrolled last week. Per family, possibly [facility].  Per discussion with family, main goals are for comfort and pain management. Patient previously had PEG tube and pulled out/refused to ever have PEG tube replaced.   Symptom management--see below.   Recommend continued admission to monitor symptom management. Patient with uncontrolled pain 2/2 wounds and hx of neuropathy and cervical spine surgeries. Hopefully pain will be better managed with higher doses of extended release morphine.   PMT will follow and assist with symptom management/breakthrough doses.   Code Status/Advance Care Planning:  DNR  Symptom Management:   MS Contin 73m PO q8h scheduled (Patient has received 165mIV Dilaudid in 24 hours. 25871mO Morphine equivalent).   Continue Dilaudid 1-2mg62m q2h prn breakthrough pain  Senna daily   Xanax 0.5mg 56mBID prn  Gabapentin 300mg 31mID neuropathy  Palliative Prophylaxis:   Aspiration, Bowel Regimen, Delirium Protocol, Frequent Pain Assessment, Oral Care and Turn Reposition  Psycho-social/Spiritual:   Desire for further Chaplaincy support:yes  Additional Recommendations: Caregiving  Support/Resources, Compassionate Wean Education and Education on Hospice  Prognosis:    Poor long-term prognosis (weeks-months) with adult failure to thrive, protein calorie  malnutrition, chronic pain, stage IV sacral decubitus, and hx lung cancer   Discharge Planning: To Be Determined: Possibly back to SNF with hospice once pain is better controlled. If further decline, consider residential hospice placement.     Primary Diagnoses: Present on Admission: . Uncontrolled pain . Lung cancer (HCC) .[place] (gastroesophageal reflux disease) . Chronic back pain . Sacral decubitus ulcer, stage IV (HCC) .[place] (do not resuscitate)   I have reviewed the medical record, interviewed the patient and family, and examined the patient. The following aspects are pertinent.  Past Medical History:  Diagnosis Date  . Anxiety   . Chronic back pain   . GERD (gastroesophageal reflux disease)   . Hepatitis C   . Hypertension   . Lung cancer (HCC)  [place]   . Osteoarthritis   . Peripheral neuropathy   . Rheumatoid arthritis (HCC)  [place] History   Socioeconomic History  . Marital status: Single    Spouse name: Not on file  . Number of  children: 0  . Years of education: GED  . Highest education level: Not on file  Occupational History  . Occupation: Disabled  Social Needs  . Financial resource strain: Not on file  . Food insecurity:    Worry: Not on file    Inability: Not on file  . Transportation needs:    Medical: Not on file    Non-medical: Not on file  Tobacco Use  . Smoking status: Current Every Day Smoker    Packs/day: 1.00    Types: Cigarettes  Substance and Sexual Activity  . Alcohol use: Yes    Alcohol/week: 0.0 standard drinks    Comment: Drinks at least 12 pack of beer per week - sometimes more.  . Drug use: No  . Sexual activity: Not on file  Lifestyle  . Physical activity:    Days per week: Not on file    Minutes per session: Not on file  . Stress: Not on file  Relationships  . Social connections:    Talks on phone: Not on file    Gets together: Not on file    Attends religious service: Not on file    Active member of club or  organization: Not on file    Attends meetings of clubs or organizations: Not on file    Relationship status: Not on file  Other Topics Concern  . Not on file  Social History Narrative   Lives at home friend.   Right-handed.   1 cup caffeine per day.   Family History  Problem Relation Age of Onset  . COPD Father   . Congestive Heart Failure Father   . Aneurysm Father   . Hypertension Mother   . Neuropathy Mother   . Dementia Mother   . Breast cancer Mother    Scheduled Meds: . gabapentin  300 mg Oral BID  . ipratropium-albuterol  3 mL Nebulization TID  . morphine  30 mg Oral Q8H  . senna  1 tablet Oral Daily   Continuous Infusions: . sodium chloride     PRN Meds:.acetaminophen **OR** acetaminophen, ALPRAZolam, antiseptic oral rinse, glycopyrrolate **OR** glycopyrrolate **OR** glycopyrrolate, haloperidol **OR** [DISCONTINUED] haloperidol **OR** haloperidol lactate, HYDROmorphone (DILAUDID) injection, ipratropium-albuterol, ondansetron **OR** ondansetron (ZOFRAN) IV, polyvinyl alcohol Medications Prior to Admission:  Prior to Admission medications   Medication Sig Start Date End Date Taking? Authorizing Provider  ALPRAZolam Duanne Moron) 1 MG tablet Take 1 mg by mouth daily as needed for anxiety.    Yes [provider]  amLODipine (NORVASC) 10 MG tablet Take 10 mg by mouth daily.    Yes [provider]  enalapril (VASOTEC) 10 MG tablet Take 10 mg by mouth daily.    Yes [provider]  hydrochlorothiazide (HYDRODIURIL) 25 MG tablet Take 25 mg by mouth daily.    Yes [provider]  HYDROcodone-acetaminophen (NORCO) 10-325 MG tablet Take 1-2 tablets by mouth every 6 (six) hours as needed for moderate pain.    Yes [provider]  morphine (MS CONTIN) 15 MG 12 hr tablet Take 15 mg by mouth every 12 (twelve) hours.  10/05/18  Yes [provider]  Omeprazole 20 MG TBEC Take 1 tablet by mouth daily.    Yes [provider]    Morphine Sulfate (MORPHINE CONCENTRATE) 10 mg / 0.5 ml concentrated solution Take 10 mg by mouth.  10/05/18   [provider]   Allergies  Allergen Reactions  . Codeine Rash   Review of Systems  Unable to perform  ROS: Mental status change    Physical Exam Vitals signs and nursing note reviewed.  Constitutional:      Appearance: He is cachectic. He is ill-appearing.  HENT:     Head: Normocephalic and atraumatic.  Pulmonary:     Effort: No tachypnea, accessory muscle usage or respiratory distress.  Skin:    General: Skin is warm and dry.  Neurological:     Mental Status: He is alert.     Comments: Oriented to person/placed. Intermittent confusion. Poor historian.  Psychiatric:        Mood and Affect: Mood normal.        Speech: Speech normal.        Behavior: Behavior is cooperative.    Vital Signs: BP 117/82 (BP Location: Right Arm)   Pulse 97   Temp 97.7 F (36.5 C) (Oral)   Resp 18   Ht 6' (1.829 m)   Wt 51.9 kg   SpO2 94%   BMI 15.52 kg/m  Pain Scale: 0-10 POSS *See Group Information*: 1-Acceptable,Awake and alert Pain Score: 8    SpO2: SpO2: 94 % O2 Device:SpO2: 94 % O2 Flow Rate: .O2 Flow Rate (L/min): 3 L/min  IO: Intake/output summary:   Intake/Output Summary (Last 24 hours) at 10/08/2018 1235 Last data filed at 10/08/2018 0900 Gross per 24 hour  Intake 588.97 ml  Output -  Net 588.97 ml    LBM: Last BM Date: 09/15/2018 Baseline Weight: Weight: 61.2 kg Most recent weight: Weight: 51.9 kg     Palliative Assessment/Data: PPS 30%   Flowsheet Rows     Most Recent Value  Intake Tab  Referral Department  Hospitalist  Unit at Time of Referral  Med/Surg Unit  Palliative Care Primary Diagnosis  -- [Adult FTT]  Date Notified  09/23/2018  Palliative Care Type  New Palliative care  Reason for referral  Clarify Goals of Care, Pain  Date of Admission  09/16/2018  Date first seen by Palliative Care  10/08/18  # of days IP prior to Palliative  referral  0  Clinical Assessment  Palliative Performance Scale Score  30%  Psychosocial & Spiritual Assessment  Palliative Care Outcomes  Patient/Family meeting held?  Yes  Who was at the meeting?  patient, brother, sister-in-law  Palliative Care Outcomes  Clarified goals of care, Provided end of life care assistance, Provided psychosocial or spiritual support, ACP counseling assistance, Improved pain interventions, Improved non-pain symptom therapy, Counseled regarding hospice      Time In: 0930 Time Out: 1100 Time Total: 69mn Greater than 50%  of this time was spent counseling and coordinating care related to the above assessment and plan.  Signed by:  MIhor Dow FNP-C Palliative Medicine Team  Phone: [phone]: [phone]  Please contact Palliative Medicine Team phone at [phone] questions and concerns.  For individual provider: See AShea Evans

## 2018-10-09 ENCOUNTER — Inpatient Hospital Stay (HOSPITAL_COMMUNITY): Payer: Medicare Other

## 2018-10-09 DIAGNOSIS — R627 Adult failure to thrive: Secondary | ICD-10-CM

## 2018-10-09 DIAGNOSIS — Z7189 Other specified counseling: Secondary | ICD-10-CM

## 2018-10-09 DIAGNOSIS — Z85118 Personal history of other malignant neoplasm of bronchus and lung: Secondary | ICD-10-CM

## 2018-10-09 MED ORDER — HYDROMORPHONE HCL 1 MG/ML IJ SOLN
1.0000 mg | INTRAMUSCULAR | Status: DC | PRN
  Administered 2018-10-09: 1 mg via INTRAVENOUS
  Filled 2018-10-09: qty 1

## 2018-10-09 MED ORDER — HYDROMORPHONE HCL 1 MG/ML IJ SOLN
1.0000 mg | INTRAMUSCULAR | Status: DC | PRN
  Administered 2018-10-09 – 2018-10-10 (×3): 1 mg via INTRAVENOUS
  Filled 2018-10-09 (×3): qty 1

## 2018-10-09 MED ORDER — IOPAMIDOL (ISOVUE-300) INJECTION 61%
50.0000 mL | Freq: Once | INTRAVENOUS | Status: AC | PRN
  Administered 2018-10-09: 30 mL via ORAL

## 2018-10-09 MED ORDER — MORPHINE SULFATE 15 MG PO TABS
15.0000 mg | ORAL_TABLET | ORAL | Status: DC | PRN
  Administered 2018-10-09: 15 mg via ORAL
  Filled 2018-10-09: qty 1

## 2018-10-09 MED ORDER — POLYETHYLENE GLYCOL 3350 17 G PO PACK: 17.0000 g | PACK | Freq: Every day | ORAL | Status: DC

## 2018-10-09 MED ORDER — IOHEXOL 300 MG/ML  SOLN
100.0000 mL | Freq: Once | INTRAMUSCULAR | Status: AC | PRN
  Administered 2018-10-09: 100 mL via INTRAVENOUS

## 2018-10-09 MED ORDER — MORPHINE SULFATE (CONCENTRATE) 10 MG/0.5ML PO SOLN
15.0000 mg | ORAL | Status: DC | PRN
  Administered 2018-10-09: 15 mg via SUBLINGUAL
  Filled 2018-10-09: qty 1

## 2018-10-09 MED ORDER — IPRATROPIUM-ALBUTEROL 0.5-2.5 (3) MG/3ML IN SOLN
3.0000 mL | Freq: Four times a day (QID) | RESPIRATORY_TRACT | Status: DC
  Administered 2018-10-10 (×2): 3 mL via RESPIRATORY_TRACT
  Filled 2018-10-09 (×2): qty 3

## 2018-10-09 NOTE — Progress Notes (Signed)
Upon arrival to patients room patient found to be with an O2 sat of 80% on 4 lpm. Patient has not been tolerating wearing a mask. Patient started on 12 LPM HFNC and O2 sats came up to 95%. Increased patient's breathing treatments to Q6H.

## 2018-10-09 NOTE — Progress Notes (Signed)
PROGRESS NOTE  Dillon Carter  PFX:[id number]  DOB: 1956-05-31  DOA: 10/05/2018 PCP: Redmond School, MD  Brief Admission Hx: 63 y.o. male sent to ED receiving hospice services from [facility] SNF sent to ED with tachycardia and uncontrolled pain.  Goal is to get pain better controlled and get patient back to [facility] SNF with hospice services, unless residential hospice is more appropriate.   MDM/Assessment & Plan:   1. Sinus tachycardia - much improved with better pain management.   2. Chronic pain - appreciate assistance of palliative care team for symptom management.  3. Stage IV sacral decubitus ulcers - the primary source of pain - continue working on pain management. 4. Lung cancer -  He had been treated for this at [facility]. 5. DNR - comfort care only. 6. Left-sided lung opacification.  Chest x-ray admission showed complete opacification of the left side.  Will obtain CT chest to rule out any underlying mass.  Since he has had persistent weight loss without any clear evidence of malignancy, will also check CT abdomen.  I suspect that he may be aspirating and have increased secretions in his left bronchus.  Disposition Plan: Pt hopeful to return to [facility] with hospice services when pain better controlled  Consultants:  Palliative medicine  Procedures:  n/a  Antimicrobials:  n/a  Subjective: Reports on going pain in sacral area. He is coughing and notes that he coughs more after he eats  Objective: Vitals:   10/09/18 0615 10/09/18 0744 10/09/18 1339 10/09/18 1426  BP: 117/80   112/70  Pulse: (!) 117   95  Resp: (!) 22   14  Temp: 98.1 F (36.7 C)     TempSrc: Oral     SpO2: 100% 99% 94% (!) 88%  Weight:      Height:        Intake/Output Summary (Last 24 hours) at 10/09/2018 1700 Last data filed at 10/09/2018 0900 Gross per 24 hour  Intake 473.83 ml  Output -  Net 473.83 ml   Filed Weights   10/01/2018 0414 09/27/2018 1200  Weight: 61.2 kg 51.9 kg   REVIEW  OF SYSTEMS  As per history otherwise all reviewed and reported negative  Exam:  General exam: Alert, awake, no distress Respiratory system: Bilateral rhonchi with increased respiratory effort. Cardiovascular system:RRR. No murmurs, rubs, gallops. Gastrointestinal system: Abdomen is nondistended, soft and nontender. No organomegaly or masses felt. Normal bowel sounds heard. Central nervous system:  No focal neurological deficits. Extremities: No C/C/E, +pedal pulses Skin: stage 4 sacral decubitus ulcers Psychiatry: mildly confused    Data Reviewed: Basic Metabolic Panel: Recent Labs  Lab 10/09/2018 0714  NA 130*  K 3.5  CL 97*  CO2 25  GLUCOSE 116*  BUN 9  CREATININE <0.30*  CALCIUM 8.0*   Liver Function Tests: Recent Labs  Lab 10/11/2018 0714  AST 13*  ALT 11  ALKPHOS 77  BILITOT 0.4  PROT 6.5  ALBUMIN 2.1*   No results for input(s): LIPASE, AMYLASE in the last 168 hours. No results for input(s): AMMONIA in the last 168 hours. CBC: Recent Labs  Lab 10/01/2018 0714  WBC 13.5*  NEUTROABS 12.7*  HGB 10.3*  HCT 33.8*  MCV 86.0  PLT 386   Cardiac Enzymes: No results for input(s): CKTOTAL, CKMB, CKMBINDEX, TROPONINI in the last 168 hours. CBG (last 3)  No results for input(s): GLUCAP in the last 72 hours. Recent Results (from the past 240 hour(s))  MRSA PCR Screening  Status: None   Collection Time: 10/01/2018  3:30 PM  Result Value Ref Range Status   MRSA by PCR NEGATIVE NEGATIVE Final    Comment:        The GeneXpert MRSA Assay (FDA approved for NASAL specimens only), is one component of a comprehensive MRSA colonization surveillance program. It is not intended to diagnose MRSA infection nor to guide or monitor treatment for MRSA infections. Performed at [facility], [address] [place], [place] [address]     Studies: No results found. Scheduled Meds: . gabapentin  300 mg Oral BID  . ipratropium-albuterol  3 mL Nebulization TID  . morphine   30 mg Oral Q8H  . polyethylene glycol  17 g Oral Daily  . senna  1 tablet Oral Daily   Continuous Infusions: . sodium chloride 20 mL/hr at 10/09/18 0400    Principal Problem:   Uncontrolled pain Active Problems:   Lung cancer (HCC)   GERD (gastroesophageal reflux disease)   Peripheral neuropathy   Chronic back pain   Goals of care, counseling/discussion   Sacral decubitus ulcer, stage IV ([facility])   DNR (do not resuscitate)   Palliative care by specialist   History of lung cancer   Adult failure to thrive  Time spent: 3mins  Kathie Dike, MD [facility] 10/09/2018, 5:00 PM    LOS: 2 days

## 2018-10-09 NOTE — Progress Notes (Addendum)
Daily Progress Note   Patient Name: Dillon Carter       Date: 10/09/2018 DOB: [birth year]  Age: 63 y.o. MRN#: [id number] Attending Physician: Kathie Dike, MD Primary Care Physician: Redmond School, MD Admit Date: 10/08/2018  Reason for Consultation/Follow-up: Establishing goals of care and Pain control  Subjectiv/GOC:  Patient awake, alert, oriented. Poor historian. Complains of 10/10 pain. Tells me the pain is "a little bit better" after initiation of MS Contin. Dyspnea with audible secretions but denies shortness of breath. Reports that he ate most of his breakfast and lunch trays.   Voicemail left for brother [name] and wife [name] to update on plan of care.    Length of Stay: 2  Current Medications: Scheduled Meds:  . gabapentin  300 mg Oral BID  . ipratropium-albuterol  3 mL Nebulization TID  . morphine  30 mg Oral Q8H  . polyethylene glycol  17 g Oral Daily  . senna  1 tablet Oral Daily    Continuous Infusions: . sodium chloride 20 mL/hr at 10/09/18 0400    PRN Meds: acetaminophen **OR** acetaminophen, ALPRAZolam, antiseptic oral rinse, glycopyrrolate **OR** [DISCONTINUED] glycopyrrolate **OR** glycopyrrolate, HYDROmorphone (DILAUDID) injection, ipratropium-albuterol, morphine, ondansetron **OR** ondansetron (ZOFRAN) IV, polyvinyl alcohol  Physical Exam Vitals signs and nursing note reviewed.  Constitutional:      General: He is awake.     Appearance: He is cachectic. He is ill-appearing.  HENT:     Head: Normocephalic and atraumatic.  Pulmonary:     Effort: No tachypnea, accessory muscle usage or respiratory distress.     Breath sounds: Rhonchi present.     Comments: Audible secretions Abdominal:     Tenderness: There is no abdominal tenderness.  Skin:  General: Skin is warm and dry.     Coloration: Skin is pale.  Neurological:     Mental Status: He is alert and oriented to person, place, and time.  Psychiatric:        Mood and Affect: Mood normal.        Speech: Speech normal.        Behavior: Behavior normal.            Vital Signs: BP 117/80 (BP Location: Right Arm)   Pulse (!) 117   Temp 98.1 F (36.7 C) (Oral)   Resp (!) 22  Ht 6' (1.829 m)   Wt 51.9 kg   SpO2 99%   BMI 15.52 kg/m  SpO2: SpO2: 99 % O2 Device: O2 Device: NRB O2 Flow Rate: O2 Flow Rate (L/min): 12 L/min  Intake/output summary:   Intake/Output Summary (Last 24 hours) at 10/09/2018 6644 Last data filed at 10/09/2018 0400 Gross per 24 hour  Intake 639.64 ml  Output -  Net 639.64 ml   LBM: Last BM Date: 09/17/2018 Baseline Weight: Weight: 61.2 kg Most recent weight: Weight: 51.9 kg       Palliative Assessment/Data: PPS 30%   Flowsheet Rows     Most Recent Value  Intake Tab  Referral Department  Hospitalist  Unit at Time of Referral  Med/Surg Unit  Palliative Care Primary Diagnosis  -- [Adult FTT]  Date Notified  10/03/2018  Palliative Care Type  New Palliative care  Reason for referral  Clarify Goals of Care, Pain  Date of Admission  09/23/2018  Date first seen by Palliative Care  10/08/18  # of days IP prior to Palliative referral  0  Clinical Assessment  Palliative Performance Scale Score  30%  Psychosocial & Spiritual Assessment  Palliative Care Outcomes  Patient/Family meeting held?  Yes  Who was at the meeting?  patient, brother, sister-in-law  Palliative Care Outcomes  Clarified goals of care, Provided end of life care assistance, Provided psychosocial or spiritual support, ACP counseling assistance, Improved pain interventions, Improved non-pain symptom therapy, Counseled regarding hospice      Patient Active Problem List   Diagnosis Date Noted  . Palliative care by specialist   . History of lung cancer   . Adult failure to thrive     . Uncontrolled pain 09/26/2018  . Lung cancer ([place]) 09/28/2018  . GERD (gastroesophageal reflux disease) 09/20/2018  . Peripheral neuropathy 10/05/2018  . Chronic back pain 09/28/2018  . Goals of care, counseling/discussion 09/24/2018  . Sacral decubitus ulcer, stage IV ([place]) 10/01/2018  . DNR (do not resuscitate) 09/20/2018  . Abnormal thyroid blood test 05/26/2016  . Bilateral leg pain 12/18/2015  . Bilateral leg edema 11/18/2015  . Abnormality of gait 11/18/2015  . Paresthesia 11/18/2015    Palliative Care Assessment & Plan   Patient Profile: 63 y.o. male  with past medical history of lung cancer in remission, hepatitis C, HTN, migraines, neuropathy, rheumatoid arthritis, GERD, anxiety, chronic pain admitted on 10/03/2018 with tachycardia, hypotension and pain. Current hospice patient at SNF. In ED, patient's pain was difficult to manage requiring multiple IV Dilaudid doses therefore hospital admission for further symptom management. Palliative medicine consultation for pain management/GOC.   Assessment: Sinus tachycardia Chronic pain Stage IV sacral decubitus ulcer Adult failure to thrive Hx of lung cancer  Recommendations/Plan:  Symptom management  MS Contin 30mg  PO TID  Roxanol 15mg  SL q2h prn pain/dyspnea  Dilaudid 1mg  IV q3h prn severe pain  Xanax 0.5mg  PO BID prn anxiety  Scheduled Miralax and Senna  Discussed plan of care with Dr. Roderic Palau. Pending CT chest and SLP evaluation.   Will further discuss GOC with patient/family pending clinical course. Tenuous respiratory status.   Code Status: DNR   Code Status Orders  (From admission, onward)         Start     Ordered   09/17/2018 0845  Do not attempt resuscitation (DNR)  Continuous    Question Answer Comment  In the event of cardiac or respiratory ARREST Do not call a "code blue"   In the event of cardiac  or respiratory ARREST Do not perform Intubation, CPR, defibrillation or ACLS   In the event of  cardiac or respiratory ARREST Use medication by any route, position, wound care, and other measures to relive pain and suffering. May use oxygen, suction and manual treatment of airway obstruction as needed for comfort.      10/09/2018 0844        Code Status History    This patient has a current code status but no historical code status.    Advance Directive Documentation     Most Recent Value  Type of Advance Directive  Out of facility DNR (pink MOST or yellow form)  Pre-existing out of facility DNR order (yellow form or pink MOST form)  Yellow form placed in chart (order not valid for inpatient use)  "MOST" Form in Place?  -       Prognosis:  Poor long-term prognosis with adult failure to thrive, protein calorie malnutrition, chronic pain, stage IV sacral decubitus, and hx of lung cancer  Discharge Planning:  To Be Determined  Care plan was discussed with patient, RN, Dr. Roderic Palau. VM left for family to provide update.  Thank you for allowing the Palliative Medicine Team to assist in the care of this patient.   Time In: 0900- 1345-  Time Out: [phone] Total Time 45 Prolonged Time Billed  no      Greater than 50%  of this time was spent counseling and coordinating care related to the above assessment and plan.  Ihor Dow, FNP-C Palliative Medicine Team  Phone: [phone] Fax: [phone]  Please contact Palliative Medicine Team phone at [phone] for questions and concerns.

## 2018-10-10 DIAGNOSIS — K117 Disturbances of salivary secretion: Secondary | ICD-10-CM

## 2018-10-10 DIAGNOSIS — J9601 Acute respiratory failure with hypoxia: Secondary | ICD-10-CM

## 2018-10-10 DIAGNOSIS — J690 Pneumonitis due to inhalation of food and vomit: Secondary | ICD-10-CM

## 2018-10-10 DIAGNOSIS — R1310 Dysphagia, unspecified: Secondary | ICD-10-CM

## 2018-10-10 DIAGNOSIS — R0600 Dyspnea, unspecified: Secondary | ICD-10-CM | POA: Insufficient documentation

## 2018-10-10 LAB — COMPREHENSIVE METABOLIC PANEL
ALT: 9 U/L (ref 0–44)
AST: 12 U/L — ABNORMAL LOW (ref 15–41)
Albumin: 1.9 g/dL — ABNORMAL LOW (ref 3.5–5.0)
Alkaline Phosphatase: 85 U/L (ref 38–126)
Anion gap: 9 (ref 5–15)
BUN: 5 mg/dL — ABNORMAL LOW (ref 8–23)
CO2: 29 mmol/L (ref 22–32)
Calcium: 7.8 mg/dL — ABNORMAL LOW (ref 8.9–10.3)
Chloride: 92 mmol/L — ABNORMAL LOW (ref 98–111)
Creatinine, Ser: <0.3 mg/dL — ABNORMAL LOW (ref 0.61–1.24)
Glucose, Bld: 125 mg/dL — ABNORMAL HIGH (ref 70–99)
POTASSIUM: 3.4 mmol/L — AB (ref 3.5–5.1)
Sodium: 130 mmol/L — ABNORMAL LOW (ref 135–145)
Total Bilirubin: 0.1 mg/dL — ABNORMAL LOW (ref 0.3–1.2)
Total Protein: 6.1 g/dL — ABNORMAL LOW (ref 6.5–8.1)

## 2018-10-10 LAB — CBC
HCT: 32.3 % — ABNORMAL LOW (ref 39.0–52.0)
Hemoglobin: 9.7 g/dL — ABNORMAL LOW (ref 13.0–17.0)
MCH: 26.9 pg (ref 26.0–34.0)
MCHC: 30 g/dL (ref 30.0–36.0)
MCV: 89.7 fL (ref 80.0–100.0)
Platelets: 480 10*3/uL — ABNORMAL HIGH (ref 150–400)
RBC: 3.6 MIL/uL — ABNORMAL LOW (ref 4.22–5.81)
RDW: 14.9 % (ref 11.5–15.5)
WBC: 20.7 10*3/uL — ABNORMAL HIGH (ref 4.0–10.5)
nRBC: 0 % (ref 0.0–0.2)

## 2018-10-10 MED ORDER — SCOPOLAMINE 1 MG/3DAYS TD PT72
1.0000 | MEDICATED_PATCH | TRANSDERMAL | Status: DC
  Administered 2018-10-10: 1.5 mg via TRANSDERMAL
  Filled 2018-10-10: qty 1

## 2018-10-10 MED ORDER — HYDROMORPHONE BOLUS VIA INFUSION
0.5000 mg | INTRAVENOUS | Status: DC | PRN
  Filled 2018-10-10: qty 1

## 2018-10-10 MED ORDER — HYDROMORPHONE HCL 1 MG/ML IJ SOLN
1.0000 mg | INTRAMUSCULAR | Status: DC | PRN
  Administered 2018-10-10: 1 mg via INTRAVENOUS
  Filled 2018-10-10: qty 1

## 2018-10-10 MED ORDER — GLYCOPYRROLATE 0.2 MG/ML IJ SOLN
0.2000 mg | Freq: Four times a day (QID) | INTRAMUSCULAR | Status: DC
  Administered 2018-10-10: 0.2 mg via INTRAVENOUS
  Filled 2018-10-10: qty 1

## 2018-10-10 MED ORDER — SODIUM CHLORIDE 0.9 % IV SOLN
0.5000 mg/h | INTRAVENOUS | Status: DC
  Administered 2018-10-10: 0.5 mg/h via INTRAVENOUS
  Filled 2018-10-10: qty 5

## 2018-10-10 MED ORDER — LORAZEPAM 2 MG/ML IJ SOLN: 1.0000 mg | INTRAMUSCULAR | Status: DC | PRN

## 2018-10-14 NOTE — Care Management Important Message (Signed)
Important Message  Patient Details  Name: Dillon Carter MRN: [id number] Date of Birth: 1956-07-12   Medicare Important Message Given:  Yes    Tommy Medal 2018-10-13, 2:31 PM

## 2018-10-14 NOTE — Progress Notes (Addendum)
Daily Progress Note   Patient Name: Dillon Carter       Date: 10/21/2018 DOB: 03/21/1956  Age: 63 y.o. MRN#: [id number] Attending Physician: Dillon Dike, MD Primary Care Physician: Dillon School, MD Admit Date: 10/08/2018  Reason for Consultation/Follow-up: Establishing goals of care and Pain control  Subjectiv/GOC: Patient unresponsive to sternal rub. Agonal, shallow respiratory pattern. Audible, terminal secretions. No tachypnea/increased work of breathing. No grimacing or agitation.   Discussed with RN and Dr. Roderic Carter.   0930: Updated family Dillon Carter and Dillon Carter) via telephone of unfortunate clinical decline, diagnoses, and poor prognosis. Dillon Carter and Dillon Carter will try and get his mother from the nursing home and plan to come to the hospital as soon as possible. Emotional support provided.  ADDENDUM 1220: F/u with patient. Family at bedside including brother, sister-in-law, and mother. Introduced role of palliative medicine. Discussed events leading up to hospitalization and course of admission including diagnoses, interventions, and symptom management. Discussed decline in clinical condition this AM. Reviewed CT results with family. Family confirms focus on ensuring Dillon Carter is as comfortable as possible and pain is managed. They understand his prognosis is hours-days. Discussed EOL expectations and comfort feeds.  Dillon Carter is more awake this afternoon but appears uncomfortable with dyspnea, tachypnea, accessory muscle use, and audible, copious secretions. Nonverbal but is squeezing family members hands and nods head "yes" to pain.   Discussed symptom management regimen and recommendation to initiation continuous infusion to ensure comfort and peace and EOL. Family agrees.   Therapeutic listening as family  shares stories of [name]. His mother, Dillon Carter, shares that he has never been the same since his best friend died ~2 years ago.   Emotional/spiritual support provided. Family shares that [name] is "good with the [facility]" and knows where he is going.   PMT contact information given.    Length of Stay: 3  Current Medications: Scheduled Meds:  . ipratropium-albuterol  3 mL Nebulization Q6H  . morphine  30 mg Oral Q8H  . polyethylene glycol  17 g Oral Daily  . senna  1 tablet Oral Daily    Continuous Infusions: . sodium chloride 20 mL/hr at 10/09/18 0400    PRN Meds: acetaminophen **OR** acetaminophen, ALPRAZolam, antiseptic oral rinse, glycopyrrolate **OR** [DISCONTINUED] glycopyrrolate **OR** glycopyrrolate, HYDROmorphone (DILAUDID) injection, ipratropium-albuterol, morphine CONCENTRATE, ondansetron **OR** ondansetron (ZOFRAN) IV, polyvinyl  alcohol  Physical Exam Vitals signs and nursing note reviewed.  Constitutional:      Appearance: He is cachectic. He is ill-appearing.     Comments: unresponsive  HENT:     Head: Normocephalic and atraumatic.  Pulmonary:     Effort: No tachypnea, accessory muscle usage or respiratory distress.     Breath sounds: Rhonchi present.     Comments: Agonal respiratory pattern. Audible secretions. Abdominal:     Tenderness: There is no abdominal tenderness.  Skin:    General: Skin is warm and dry.     Coloration: Skin is pale.  Neurological:     Mental Status: He is unresponsive.            Vital Signs: BP 125/72 (BP Location: Right Arm)   Pulse (!) 134   Temp 98.3 F (36.8 C) (Oral)   Resp (!) 22   Ht 6' (1.829 m)   Wt 51.9 kg   SpO2 (!) 86%   BMI 15.52 kg/m  SpO2: SpO2: (!) 86 % O2 Device: O2 Device: High Flow Nasal Cannula O2 Flow Rate: O2 Flow Rate (L/min): 12 L/min  Intake/output summary:   Intake/Output Summary (Last 24 hours) at 11-08-2018 0932 Last data filed at 10/09/2018 2300 Gross per 24 hour  Intake 240 ml  Output 1000 ml    Net -760 ml   LBM: Last BM Date: 10/06/2018 Baseline Weight: Weight: 61.2 kg Most recent weight: Weight: 51.9 kg       Palliative Assessment/Data: PPS 10%   Flowsheet Rows     Most Recent Value  Intake Tab  Referral Department  Hospitalist  Unit at Time of Referral  Med/Surg Unit  Palliative Care Primary Diagnosis  -- [Adult FTT]  Date Notified  10/05/2018  Palliative Care Type  New Palliative care  Reason for referral  Clarify Goals of Care, Pain  Date of Admission  09/19/2018  Date first seen by Palliative Care  10/08/18  # of days IP prior to Palliative referral  0  Clinical Assessment  Palliative Performance Scale Score  10%  Psychosocial & Spiritual Assessment  Palliative Care Outcomes  Patient/Family meeting held?  Yes  Who was at the meeting?  brother, sister-in-law  Palliative Care Outcomes  Clarified goals of care, Provided end of life care assistance, Provided psychosocial or spiritual support, Improved pain interventions, Improved non-pain symptom therapy, Changed to focus on comfort      Patient Active Problem List   Diagnosis Date Noted  . Palliative care by specialist   . History of lung cancer   . Adult failure to thrive   . Uncontrolled pain 09/20/2018  . Lung cancer ([place]) 10/11/2018  . GERD (gastroesophageal reflux disease) 09/17/2018  . Peripheral neuropathy 09/20/2018  . Chronic back pain 09/23/2018  . Goals of care, counseling/discussion 10/11/2018  . Sacral decubitus ulcer, stage IV ([place]) 09/16/2018  . DNR (do not resuscitate) 10/05/2018  . Abnormal thyroid blood test 05/26/2016  . Bilateral leg pain 12/18/2015  . Bilateral leg edema 11/18/2015  . Abnormality of gait 11/18/2015  . Paresthesia 11/18/2015    Palliative Care Assessment & Plan   Patient Profile: 63 y.o. male  with past medical history of lung cancer in remission, hepatitis C, HTN, migraines, neuropathy, rheumatoid arthritis, GERD, anxiety, chronic pain admitted on 09/29/2018 with  tachycardia, hypotension and pain. Current hospice patient at SNF. In ED, patient's pain was difficult to manage requiring multiple IV Dilaudid doses therefore hospital admission for further symptom management. Palliative medicine  consultation for pain management/GOC.   Assessment: Sinus tachycardia Chronic pain Stage IV sacral decubitus ulcer Adult failure to thrive Hx of lung cancer Aspiration  Recommendations/Plan:  Clinical decline this AM. Patient appears to be actively dying with unresponsiveness and agonal respirations. Updated family who plan to come to the hospital as soon as possible. DNR/DNI, goal is comfort/symptom management.  Continue prn medications to ensure comfort at EOL.   Anticipate hospital death.  ADDENDUM 1220: Family at bedside. Patient awake but uncomfortable, restless, complaining of pain and with copious secretions and tachypnea.   Initiate Dilaudid 0.5mg /hr IV continuous infusion  RN may bolus Dilaudid via infusion 0.5-1mg  q58min prn pain/dyspnea/tachypnea/air hunger  Robinul 0.2mg  q6h scheduled for copious secretions  Scopolamine patch  Ativan 1mg  IV q4h prn anxiety/agitation   Code Status: DNR   Code Status Orders  (From admission, onward)         Start     Ordered   10/11/2018 0845  Do not attempt resuscitation (DNR)  Continuous    Question Answer Comment  In the event of cardiac or respiratory ARREST Do not call a "code blue"   In the event of cardiac or respiratory ARREST Do not perform Intubation, CPR, defibrillation or ACLS   In the event of cardiac or respiratory ARREST Use medication by any route, position, wound care, and other measures to relive pain and suffering. May use oxygen, suction and manual treatment of airway obstruction as needed for comfort.      09/17/2018 0844        Code Status History    This patient has a current code status but no historical code status.    Advance Directive Documentation     Most Recent Value    Type of Advance Directive  Out of facility DNR (pink MOST or yellow form)  Pre-existing out of facility DNR order (yellow form or pink MOST form)  Yellow form placed in chart (order not valid for inpatient use)  "MOST" Form in Place?  -       Prognosis:  Actively dying: possibly hours with adult failure to thrive, protein calorie malnutrition, chronic pain, stage IV sacral decubitus, aspiration, and hx of lung cancer.   Discharge Planning:  Anticipated Hospital Death  Care plan was discussed with RN, Dr. Roderic Carter, family (mother, brother, sister-in-law)  Thank you for allowing the Palliative Medicine Team to assist in the care of this patient.   Time In: 0910- 1220- Time Out: 0930 1330 Total Time 90 Prolonged Time Billed  yes      Greater than 50%  of this time was spent counseling and coordinating care related to the above assessment and plan.  Ihor Dow, DNP, FNP-C Palliative Medicine Team  Phone: [phone] Fax: [phone]  Please contact Palliative Medicine Team phone at [phone] for questions and concerns.

## 2018-10-14 NOTE — Progress Notes (Signed)
   2018-10-19 1744  Attending [place]  Attending Physician Notified Y  Attending Physician (First and Last Name) Kathie Dike   Will the above attending physician sign death certificate? Yes  Post Mortem Checklist  Date of Death 10/19/2018  Time of Death Nov 02, 1742  Next of kin notified Yes  Name of next of kin notified of death Jasyah Theurer  Contact Person's Relationship to Patient Parent  Contact Person's Phone Number [phone]  Was the patient a No Code Blue or a Limited Code Blue? Yes  Did the patient die unattended? No  Patient restrained? Not applicable  Height 6' (2.023 m)  Weight 51.9 kg  [place] [facility]  Notification Date 19-Oct-2018  Notification Time [place] [facility] Number [phone]  Is patient a potential donor? N  Patient Belongings/Medications Returned  Patient belongings from bedside/safe/pharmacy returned  Yes  Dead on Arrival (Emergency Department)  Patient dead on arrival? No  Medical Examiner  Is this a medical examiner's case? [facility] home name/address/phone # [facility] - [address] [place] - [phone]  Planned location of pickup Room

## 2018-10-14 NOTE — Discharge Summary (Signed)
Death Summary  Dillon Carter IWP:[id number] DOB: Oct 19, 1955 DOA: 10-30-2018  PCP: Redmond School, MD  Admit date: 2018/10/30 Date of Death: 2018-11-02 Time of Death: 17:44  History of present illness:  Dillon Carter is a 63 y.o. male with a history of previous lung cancer status post lobectomy and radiation has been at a nursing facility for quite some time.  Patient had become increasingly deconditioned, was not eating and drinking was failing to thrive.  He developed stage IV sacral decubitus ulcers which caused significant pain and were nonhealing.  Due to his progressive decline, he was enrolled in hospice services at the nursing facility.  When his pain was not controlled at the facility, he was sent to the hospital for further treatments.  On arrival to the emergency room, chest x-ray was performed that showed left-sided opacification of the left lung.  Further work-up with CT chest, abdomen pelvis showed that the patient was aspirating contrast which is likely cause of his left-sided opacification.  He became increasingly hypoxic and less responsive.  He was seen by palliative care and goals of care was discussed with the patient's family.  They agreed that comfort should be the prime focus of his care.  The patient was placed on Dilaudid infusion for respiratory distress.  He continued to decline.  On 11-02-2022, he was found without pulse and respirations were pronounced dead.  Family was present and provided comfort.  Final Diagnoses:  Principal Problem:   Uncontrolled pain Active Problems:   GERD (gastroesophageal reflux disease)   Peripheral neuropathy   Chronic back pain   Terminal care   Sacral decubitus ulcer, stage IV (HCC)   DNR (do not resuscitate)   Palliative care by specialist   History of lung cancer   Adult failure to thrive   Increased oropharyngeal secretions   Acute respiratory failure with hypoxia ([place])   Aspiration pneumonia ([place])   Dysphagia     The results of  significant diagnostics from this hospitalization (including imaging, microbiology, ancillary and laboratory) are listed below for reference.    Significant Diagnostic Studies: Ct Chest W Contrast  Result Date: 10/09/2018 CLINICAL DATA:  Presented to ED with tachycardia. History of lung cancer. Opacification of left chest on chest radiograph. Persistent weight loss. EXAM: CT CHEST, ABDOMEN, AND PELVIS WITH CONTRAST TECHNIQUE: Multidetector CT imaging of the chest, abdomen and pelvis was performed following the standard protocol during bolus administration of intravenous contrast. CONTRAST:  173mL OMNIPAQUE IOHEXOL 300 MG/ML SOLN, 71mL ISOVUE-300 IOPAMIDOL (ISOVUE-300) INJECTION 61% COMPARISON:  CT chest 05/08/2009. CT abdomen and pelvis 12/03/2012 FINDINGS: CT CHEST FINDINGS Cardiovascular: Normal heart size. Prominent coronary artery calcifications. No pericardial effusions. Normal caliber thoracic aorta. No aortic dissection. Calcification of the aorta. Central pulmonary arteries are incidentally well opacified without evidence of significant pulmonary embolus. Mediastinum/Nodes: Prominent mediastinal lymphadenopathy. Right supraclavicular lymph nodes measuring up to 1.6 cm diameter. Right paratracheal lymph nodes measuring up to 2.5 cm diameter. Lymphadenopathy surrounds the trachea causing narrowing of the trachea. Subcarinal lymphadenopathy measures up to 2.3 cm diameter. Left aortopulmonic window lymphadenopathy measuring 2.4 cm diameter. Likely this represents metastatic disease. Esophagus is decompressed but there is residual contrast material in the esophagus. This could be due to reflux or dysmotility. Lungs/Pleura: Surgical clips in the left hilum likely representing partial left pneumonectomy. Contrast material is demonstrated in the left mainstem bronchus with fluid extending throughout the left bronchi. This is consistent with aspiration. There is complete consolidation and volume loss of the  remaining  left lung without air bronchograms. This could be due to severe aspiration pneumonia or recurrent mass with postobstructive change. Small bilateral pleural effusions. Multiple areas of airspace consolidation demonstrated throughout the right lung. Bronchial wall thickening with some cystic bronchiectasis. Likely indicates aspiration, possibly multifocal pneumonia. No pneumothorax. Musculoskeletal: Postoperative changes in the cervical spine. Degenerative changes in the thoracic spine. No destructive bone lesions. CT ABDOMEN PELVIS FINDINGS Hepatobiliary: No focal liver abnormality is seen. No gallstones, gallbladder wall thickening, or biliary dilatation. Pancreas: Unremarkable. No pancreatic ductal dilatation or surrounding inflammatory changes. Spleen: Normal in size without focal abnormality. Adrenals/Urinary Tract: No adrenal gland nodules. Multiple intrarenal stones bilaterally. Largest in the left kidney measures 8 mm maximal diameter. Nephrograms are symmetrical. No hydronephrosis or hydroureter. Bladder is moderately distended with urine. No bladder wall thickening. Filling defects layering in the bladder. Some demonstrate calcific density, likely stones. Other filling defects are of increased density but less than calcium. This could represent hemorrhage or debris. Stomach/Bowel: Stomach is distended with ingested material. No gastric wall thickening. Small bowel are not abnormally distended. Scattered stool throughout the colon. The rectosigmoid colon demonstrates diffuse wall thickening with mural edema, likely representing inflammatory process. Consider proctitis/colitis versus inflammatory bowel disease. Appendix is not identified. Vascular/Lymphatic: Aortic atherosclerosis. No enlarged abdominal or pelvic lymph nodes. Reproductive: Prostate is unremarkable. Other: Stranding in the pericolonic gutters likely representing mild ascites. No free air. Musculoskeletal: Degenerative changes in the  lumbar spine. Small lucent lesions are demonstrated in the L3 vertebra. This might be related to degenerative changes but early bone metastasis can be excluded. No other lucent lesions identified. Spondylolysis with mild spondylolisthesis at L5-S1. IMPRESSION: CHEST: 1. Postoperative changes consistent with partial left pneumonectomy. 2. Contrast material demonstrated in the left mainstem bronchus extending into the left bronchi consistent with aspiration. 3. Complete consolidation and volume loss in the left lung, possibly due to aspiration or obstruction due to recurrent mass. 4. Multiple areas of airspace consolidation throughout the right lung. Bronchial wall thickening with cystic bronchiectasis. Changes likely to represent aspiration, possibly multifocal pneumonia. 5. Small bilateral pleural effusions. 6. Prominent diffuse mediastinal lymphadenopathy, likely representing metastatic disease. ABDOMEN PELVIS: 1. Diffuse wall thickening of the rectosigmoid colon with mural edema and infiltration in the pericolonic fat. Changes likely to represent inflammatory process such as proctitis/colitis or inflammatory bowel disease. 2. Filling defects in the bladder likely indicate stones with hemorrhage or debris. Multiple bilateral nonobstructing intrarenal stones. 3. Mild ascites. 4. Aortic atherosclerosis. 5. Possible lucent metastasis in the L3 vertebra. Electronically Signed   By: Lucienne Capers M.D.   On: 10/09/2018 22:20   Ct Abdomen Pelvis W Contrast  Result Date: 10/09/2018 CLINICAL DATA:  Presented to ED with tachycardia. History of lung cancer. Opacification of left chest on chest radiograph. Persistent weight loss. EXAM: CT CHEST, ABDOMEN, AND PELVIS WITH CONTRAST TECHNIQUE: Multidetector CT imaging of the chest, abdomen and pelvis was performed following the standard protocol during bolus administration of intravenous contrast. CONTRAST:  148mL OMNIPAQUE IOHEXOL 300 MG/ML SOLN, 15mL ISOVUE-300 IOPAMIDOL  (ISOVUE-300) INJECTION 61% COMPARISON:  CT chest 05/08/2009. CT abdomen and pelvis 12/03/2012 FINDINGS: CT CHEST FINDINGS Cardiovascular: Normal heart size. Prominent coronary artery calcifications. No pericardial effusions. Normal caliber thoracic aorta. No aortic dissection. Calcification of the aorta. Central pulmonary arteries are incidentally well opacified without evidence of significant pulmonary embolus. Mediastinum/Nodes: Prominent mediastinal lymphadenopathy. Right supraclavicular lymph nodes measuring up to 1.6 cm diameter. Right paratracheal lymph nodes measuring up to 2.5 cm diameter. Lymphadenopathy surrounds the trachea  causing narrowing of the trachea. Subcarinal lymphadenopathy measures up to 2.3 cm diameter. Left aortopulmonic window lymphadenopathy measuring 2.4 cm diameter. Likely this represents metastatic disease. Esophagus is decompressed but there is residual contrast material in the esophagus. This could be due to reflux or dysmotility. Lungs/Pleura: Surgical clips in the left hilum likely representing partial left pneumonectomy. Contrast material is demonstrated in the left mainstem bronchus with fluid extending throughout the left bronchi. This is consistent with aspiration. There is complete consolidation and volume loss of the remaining left lung without air bronchograms. This could be due to severe aspiration pneumonia or recurrent mass with postobstructive change. Small bilateral pleural effusions. Multiple areas of airspace consolidation demonstrated throughout the right lung. Bronchial wall thickening with some cystic bronchiectasis. Likely indicates aspiration, possibly multifocal pneumonia. No pneumothorax. Musculoskeletal: Postoperative changes in the cervical spine. Degenerative changes in the thoracic spine. No destructive bone lesions. CT ABDOMEN PELVIS FINDINGS Hepatobiliary: No focal liver abnormality is seen. No gallstones, gallbladder wall thickening, or biliary dilatation.  Pancreas: Unremarkable. No pancreatic ductal dilatation or surrounding inflammatory changes. Spleen: Normal in size without focal abnormality. Adrenals/Urinary Tract: No adrenal gland nodules. Multiple intrarenal stones bilaterally. Largest in the left kidney measures 8 mm maximal diameter. Nephrograms are symmetrical. No hydronephrosis or hydroureter. Bladder is moderately distended with urine. No bladder wall thickening. Filling defects layering in the bladder. Some demonstrate calcific density, likely stones. Other filling defects are of increased density but less than calcium. This could represent hemorrhage or debris. Stomach/Bowel: Stomach is distended with ingested material. No gastric wall thickening. Small bowel are not abnormally distended. Scattered stool throughout the colon. The rectosigmoid colon demonstrates diffuse wall thickening with mural edema, likely representing inflammatory process. Consider proctitis/colitis versus inflammatory bowel disease. Appendix is not identified. Vascular/Lymphatic: Aortic atherosclerosis. No enlarged abdominal or pelvic lymph nodes. Reproductive: Prostate is unremarkable. Other: Stranding in the pericolonic gutters likely representing mild ascites. No free air. Musculoskeletal: Degenerative changes in the lumbar spine. Small lucent lesions are demonstrated in the L3 vertebra. This might be related to degenerative changes but early bone metastasis can be excluded. No other lucent lesions identified. Spondylolysis with mild spondylolisthesis at L5-S1. IMPRESSION: CHEST: 1. Postoperative changes consistent with partial left pneumonectomy. 2. Contrast material demonstrated in the left mainstem bronchus extending into the left bronchi consistent with aspiration. 3. Complete consolidation and volume loss in the left lung, possibly due to aspiration or obstruction due to recurrent mass. 4. Multiple areas of airspace consolidation throughout the right lung. Bronchial wall  thickening with cystic bronchiectasis. Changes likely to represent aspiration, possibly multifocal pneumonia. 5. Small bilateral pleural effusions. 6. Prominent diffuse mediastinal lymphadenopathy, likely representing metastatic disease. ABDOMEN PELVIS: 1. Diffuse wall thickening of the rectosigmoid colon with mural edema and infiltration in the pericolonic fat. Changes likely to represent inflammatory process such as proctitis/colitis or inflammatory bowel disease. 2. Filling defects in the bladder likely indicate stones with hemorrhage or debris. Multiple bilateral nonobstructing intrarenal stones. 3. Mild ascites. 4. Aortic atherosclerosis. 5. Possible lucent metastasis in the L3 vertebra. Electronically Signed   By: Lucienne Capers M.D.   On: 10/09/2018 22:20   Dg Chest Port 1 View  Result Date: 10/04/2018 CLINICAL DATA:  63 year old male with tachycardia. EXAM: PORTABLE CHEST 1 VIEW COMPARISON:  Chest radiograph dated 07/29/2009 FINDINGS: There is complete opacification of the left hemithorax which may represent combination of atelectasis/infiltrate and pleural effusion. A mass is not excluded further evaluation with chest CT is recommended. The right lung is clear. There  is slight shift of the mediastinum into the left hemithorax. No pneumothorax. No acute osseous pathology. Degenerative changes of the spine and lower cervical fixation hardware. IMPRESSION: Complete opacification of the left hemithorax, likely combination of atelectasis/infiltrate and pleural effusion. A lung mass is not excluded. Further evaluation with chest CT recommended. Electronically Signed   By: Anner Crete M.D.   On: 10/01/2018 05:50    Microbiology: Recent Results (from the past 240 hour(s))  MRSA PCR Screening     Status: None   Collection Time: 10/03/2018  3:30 PM  Result Value Ref Range Status   MRSA by PCR NEGATIVE NEGATIVE Final    Comment:        The GeneXpert MRSA Assay (FDA approved for NASAL  specimens only), is one component of a comprehensive MRSA colonization surveillance program. It is not intended to diagnose MRSA infection nor to guide or monitor treatment for MRSA infections. Performed at [facility], [address], [place], [place] [address]      Labs: Basic Metabolic Panel: Recent Labs  Lab 10/09/2018 0714 Oct 19, 2018 0505  NA 130* 130*  K 3.5 3.4*  CL 97* 92*  CO2 25 29  GLUCOSE 116* 125*  BUN 9 5*  CREATININE <0.30* <0.30*  CALCIUM 8.0* 7.8*   Liver Function Tests: Recent Labs  Lab 09/23/2018 0714 October 19, 2018 0505  AST 13* 12*  ALT 11 9  ALKPHOS 77 85  BILITOT 0.4 0.1*  PROT 6.5 6.1*  ALBUMIN 2.1* 1.9*   No results for input(s): LIPASE, AMYLASE in the last 168 hours. No results for input(s): AMMONIA in the last 168 hours. CBC: Recent Labs  Lab 09/18/2018 0714 19-Oct-2018 0505  WBC 13.5* 20.7*  NEUTROABS 12.7*  --   HGB 10.3* 9.7*  HCT 33.8* 32.3*  MCV 86.0 89.7  PLT 386 480*   Cardiac Enzymes: No results for input(s): CKTOTAL, CKMB, CKMBINDEX, TROPONINI in the last 168 hours. D-Dimer No results for input(s): DDIMER in the last 72 hours. BNP: Invalid input(s): POCBNP CBG: No results for input(s): GLUCAP in the last 168 hours. Anemia work up No results for input(s): VITAMINB12, FOLATE, FERRITIN, TIBC, IRON, RETICCTPCT in the last 72 hours. Urinalysis No results found for: COLORURINE, APPEARANCEUR, LABSPEC, [place], GLUCOSEU, [place], BILIRUBINUR, KETONESUR, PROTEINUR, UROBILINOGEN, NITRITE, LEUKOCYTESUR Sepsis Labs Invalid input(s): PROCALCITONIN,  WBC,  LACTICIDVEN     SIGNED:  Kathie Dike, MD  [facility] 19-Oct-2018, 6:48 PM Pager   If 7PM-7AM, please contact night-coverage [email or web address] Password [email or web address]

## 2018-10-14 NOTE — Progress Notes (Signed)
Patient unresponsive and has agonal breathing. MD has been informed. Patient's brother Dillon Carter has been contacted via telephone.

## 2018-10-14 NOTE — Progress Notes (Signed)
Patient expired at 1744. Vista Deck RN and [place] Foley RN has pronounced time of death. MD has been notified.

## 2018-10-14 NOTE — Progress Notes (Signed)
Hydromorphone (Dilaudid) 50 mg in sodium chloride 0.9 % 100 mL (0.5 mg/mL) infusion has been stopped at 1744 at time of death. Patient received 2.2 mL of medication and 97.8 mL has been wasted in stericycle.Marye Round Foley RN and Vista Deck RN has witnessed and wasted medication in stericycle.

## 2018-10-14 NOTE — Progress Notes (Signed)
Patient's mother was in room alone with patient at time of death. Pt's extended family currently in room at this time.

## 2018-10-14 DEATH — deceased

## 2020-03-21 IMAGING — RF DG SWALLOWING FUNCTION
1 series · 1 of 1 positions shown · non-contrast
Comparison: None

CLINICAL DATA: Dysphagia, history of lung cancer

EXAM:
MODIFIED BARIUM SWALLOW
TECHNIQUE: Different consistencies of barium were administered orally to the
patient by the Speech Pathologist. Imaging of the pharynx was
performed in the lateral projection.
FLUOROSCOPY TIME:  Fluoroscopy Time:  3 minutes 42 seconds
Radiation Exposure Index (if provided by the fluoroscopic device):
15.3 mGy
Number of Acquired Spot Images: multiple fluoroscopic screen
captures

[Series 1: cp_standard · 0.26mm/px · 1 of 1 slices shown]
[im 1/1]
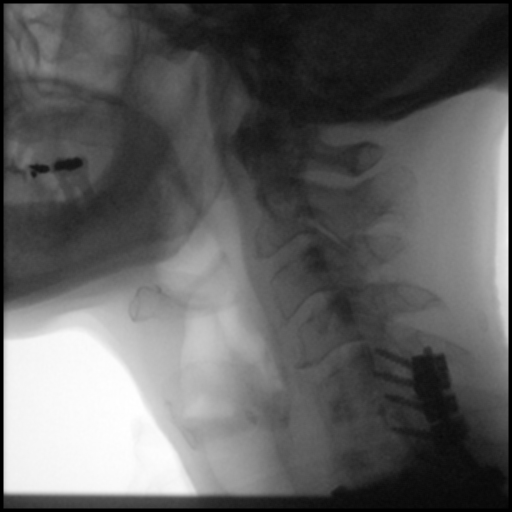

[1 of 1 positions shown; findings below may reference images not displayed]

FINDINGS: Thin liquid-evaluated with teaspoon, cup, and straw presentations.
Premature spillover of thin barium to the vallecula and piriform
sinuses. Delayed initiation. Laryngeal penetration to the level of
the vocal cords without aspiration. Residual contrast along the
vocal cords, difficult for patient to clear. Laryngeal penetration
did not occur with chin tuck maneuver. Mild vallecular and piriform
sinus residuals. Noted failure of epiglottic inversion throughout
evaluation.

Nectar thick liquid-premature spillover to the piriform sinuses with
delayed initiation. No laryngeal penetration or aspiration. Minimal
vallecular residuals.

Honey-not evaluated

Pure- within normal limits

Cracker-minimal vallecular residuals. Otherwise within normal limits

Pure with cracker-not evaluated

Barium tablet-laryngeal penetration and aspiration of the thin
barium contrast utilized to swallow the barium tablet was identified
with note of a spontaneous cough reflex by patient. Mild vallecular
and piriform sinus residuals. Patient was able swallow the barium
tablet, which passed from the oral cavity to the stomach without
obstruction.
IMPRESSION: Swallowing dysfunction as above.

Please refer to the Speech Pathologists report for complete details
and recommendations.
